# Patient Record
Sex: Male | Born: 1945
Health system: Southern US, Community
[De-identification: ages and names within clinical notes are randomized; demographics above are authoritative.]

## PROBLEM LIST (undated history)

## (undated) DIAGNOSIS — F329 Major depressive disorder, single episode, unspecified: Secondary | ICD-10-CM

## (undated) DIAGNOSIS — I4891 Unspecified atrial fibrillation: Secondary | ICD-10-CM

## (undated) DIAGNOSIS — E785 Hyperlipidemia, unspecified: Secondary | ICD-10-CM

## (undated) DIAGNOSIS — I10 Essential (primary) hypertension: Secondary | ICD-10-CM

## (undated) DIAGNOSIS — F32A Depression, unspecified: Secondary | ICD-10-CM

## (undated) DIAGNOSIS — J449 Chronic obstructive pulmonary disease, unspecified: Secondary | ICD-10-CM

## (undated) DIAGNOSIS — I639 Cerebral infarction, unspecified: Secondary | ICD-10-CM

## (undated) HISTORY — DX: Depression, unspecified: F32.A

## (undated) HISTORY — DX: Major depressive disorder, single episode, unspecified: F32.9

## (undated) HISTORY — PX: CAROTID ENDARTERECTOMY: SUR193

## (undated) HISTORY — DX: Cerebral infarction, unspecified: I63.9

## (undated) HISTORY — PX: ANKLE SURGERY: SHX546

## (undated) HISTORY — DX: Essential (primary) hypertension: I10

## (undated) HISTORY — DX: Hyperlipidemia, unspecified: E78.5

## (undated) HISTORY — DX: Chronic obstructive pulmonary disease, unspecified: J44.9

---

## 2011-07-20 DIAGNOSIS — E559 Vitamin D deficiency, unspecified: Secondary | ICD-10-CM | POA: Diagnosis not present

## 2011-07-20 DIAGNOSIS — R079 Chest pain, unspecified: Secondary | ICD-10-CM | POA: Diagnosis not present

## 2011-07-20 DIAGNOSIS — I10 Essential (primary) hypertension: Secondary | ICD-10-CM | POA: Diagnosis not present

## 2011-07-20 DIAGNOSIS — R7309 Other abnormal glucose: Secondary | ICD-10-CM | POA: Diagnosis not present

## 2011-07-20 DIAGNOSIS — E782 Mixed hyperlipidemia: Secondary | ICD-10-CM | POA: Diagnosis not present

## 2011-07-30 ENCOUNTER — Ambulatory Visit (HOSPITAL_COMMUNITY): Payer: Medicare Other | Attending: Internal Medicine | Admitting: Radiology

## 2011-07-30 ENCOUNTER — Encounter: Payer: Self-pay | Admitting: Internal Medicine

## 2011-07-30 VITALS — BP 140/82 | HR 63 | Ht 71.0 in | Wt 258.0 lb

## 2011-07-30 DIAGNOSIS — R0602 Shortness of breath: Secondary | ICD-10-CM

## 2011-07-30 DIAGNOSIS — R079 Chest pain, unspecified: Secondary | ICD-10-CM | POA: Diagnosis not present

## 2011-07-30 MED ORDER — TECHNETIUM TC 99M TETROFOSMIN IV KIT
11.0000 | PACK | Freq: Once | INTRAVENOUS | Status: AC | PRN
  Administered 2011-07-30: 11 via INTRAVENOUS

## 2011-07-30 NOTE — Progress Notes (Signed)
[facility] SITE 3 NUCLEAR MED [address] [place] [address] [phone]  Cardiology Nuclear Med Study  George Delgado is a 66 y.o. male     MRN : [id number]     DOB: 05-05-1945  Procedure Date: 08/02/2011  Nuclear Med Background Indication for Stress Test:  Evaluation for Ischemia History:  1980's MPS @ [facility] per pt normal. Cardiac Risk Factors: Carotid Disease, History of Smoking, Hypertension, Lipids and Obesity  Symptoms:  Chest Pressure with Exertion (last episode of chest discomfort was about 3-weeks ago), Diaphoresis, DOE, Palpitations and Rapid HR   Nuclear Pre-Procedure Caffeine/Decaff Intake:  None NPO After: 7:00am   Lungs:  Clear. O2 Sat: 98% on room air. IV 0.9% NS with Angio Cath:  22g  IV Site: R Hand  IV Started by:  Stanton Kidney, EMT-P  Chest Size (in):  44 Cup Size: n/a  Height: 5\' 11"  (1.803 m)  Weight:  258 lb (117.028 kg)  BMI:  Body mass index is 35.98 kg/(m^2). Tech Comments:  Atenolol held > 24 hours, per patient.    Nuclear Med Study 1 or 2 day study: 2 day  Stress Test Type:  Treadmill/Lexiscan  Reading MD: Willa Rough, MD  Order Authorizing Provider:  Lucky Cowboy, MD  Resting Radionuclide: Technetium 3m Tetrofosmin  Resting Radionuclide Dose: 11.0 mCi   Stress Radionuclide:  Technetium 7m Tetrofosmin  Stress Radionuclide Dose: 33.0 mCi           Stress Protocol Rest HR: 63 Stress HR: 96  Rest BP: 140/82 Stress BP: 157/77  Exercise Time (min): 2:00 METS: n/a   Predicted Max HR: 152 bpm % Max HR: 63.16 bpm Rate Pressure Product: 21308   Dose of Adenosine (mg):  n/a Dose of Lexiscan: 0.4 mg  Dose of Atropine (mg): n/a Dose of Dobutamine: n/a mcg/kg/min (at max HR)  Stress Test Technologist: Smiley Houseman, CMA-N  Nuclear Technologist:  Domenic Polite, CNMT     Rest Procedure:  Myocardial perfusion imaging was performed at rest 45 minutes following the intravenous administration of Technetium 69m  Tetrofosmin.  Rest ECG: No acute changes  Stress Procedure:  The patient attempted to walk the treadmill for 7:16, but was unable to reach his target heart rate and could not be changed to Lexiscan this day due to caffeine.  He had no diagnostic ST-T wave changes or chest pain.  He did have occasional PVC's/PAC's and a hypertensive response of 201/96.   The patient came back a 2nd day and received IV Lexiscan 0.4 mg over 15-seconds with concurrent low level exercise and then Technetium 56m Tetrofosmin was injected at 30-seconds while the patient continued walking one more minute. There were no significant changes with Lexiscan. Quantitative spect images were obtained after a 45-minute delay.  Stress ECG: No significant change from baseline ECG  QPS Raw Data Images:  Patient motion noted; appropriate software correction applied. Stress Images:  Normal homogeneous uptake in all areas of the myocardium. Rest Images:  Normal homogeneous uptake in all areas of the myocardium. Subtraction (SDS):  No evidence of ischemia. Transient Ischemic Dilatation (Normal <1.22):  0.99 Lung/Heart Ratio (Normal <0.45):  0.33  Quantitative Gated Spect Images QGS EDV:  54 ml QGS ESV:  12 ml  Impression Exercise Capacity:  Lexiscan with low level exercise. BP Response:  Normal blood pressure response. Clinical Symptoms:  shortness of breath ECG Impression:  No significant ST segment change suggestive of ischemia. Comparison with Prior Nuclear Study: No previous nuclear  study performed  Overall Impression:  Normal stress nuclear study.  LV Ejection Fraction: 77%.  LV Wall Motion:  Normal Wall Motion  Willa Rough, MD

## 2011-08-02 ENCOUNTER — Ambulatory Visit (HOSPITAL_COMMUNITY): Payer: Medicare Other | Attending: Cardiology

## 2011-08-02 DIAGNOSIS — R0989 Other specified symptoms and signs involving the circulatory and respiratory systems: Secondary | ICD-10-CM

## 2011-08-02 MED ORDER — REGADENOSON 0.4 MG/5ML IV SOLN
0.4000 mg | Freq: Once | INTRAVENOUS | Status: AC
  Administered 2011-08-02: 0.4 mg via INTRAVENOUS

## 2011-08-02 MED ORDER — TECHNETIUM TC 99M TETROFOSMIN IV KIT
33.0000 | PACK | Freq: Once | INTRAVENOUS | Status: AC | PRN
  Administered 2011-08-02: 33 via INTRAVENOUS

## 2011-08-03 NOTE — Progress Notes (Signed)
Copy of nuclear report Faxed to Dr. Astrid Divine @ [phone].[place]

## 2011-09-01 DIAGNOSIS — W108XXA Fall (on) (from) other stairs and steps, initial encounter: Secondary | ICD-10-CM | POA: Diagnosis not present

## 2011-09-01 DIAGNOSIS — S4980XA Other specified injuries of shoulder and upper arm, unspecified arm, initial encounter: Secondary | ICD-10-CM | POA: Diagnosis not present

## 2011-09-01 DIAGNOSIS — M25519 Pain in unspecified shoulder: Secondary | ICD-10-CM | POA: Diagnosis not present

## 2011-09-10 DIAGNOSIS — M79609 Pain in unspecified limb: Secondary | ICD-10-CM | POA: Diagnosis not present

## 2011-09-10 DIAGNOSIS — R55 Syncope and collapse: Secondary | ICD-10-CM | POA: Diagnosis not present

## 2011-09-10 DIAGNOSIS — R079 Chest pain, unspecified: Secondary | ICD-10-CM | POA: Diagnosis not present

## 2011-09-20 DIAGNOSIS — I6789 Other cerebrovascular disease: Secondary | ICD-10-CM | POA: Diagnosis not present

## 2011-09-20 DIAGNOSIS — R002 Palpitations: Secondary | ICD-10-CM | POA: Diagnosis not present

## 2011-09-20 DIAGNOSIS — Z0000 Encounter for general adult medical examination without abnormal findings: Secondary | ICD-10-CM | POA: Diagnosis not present

## 2011-09-20 DIAGNOSIS — Z79899 Other long term (current) drug therapy: Secondary | ICD-10-CM | POA: Diagnosis not present

## 2011-09-20 DIAGNOSIS — I10 Essential (primary) hypertension: Secondary | ICD-10-CM | POA: Diagnosis not present

## 2011-10-24 DIAGNOSIS — I498 Other specified cardiac arrhythmias: Secondary | ICD-10-CM | POA: Diagnosis not present

## 2011-10-24 DIAGNOSIS — I4949 Other premature depolarization: Secondary | ICD-10-CM | POA: Diagnosis not present

## 2011-10-24 DIAGNOSIS — R002 Palpitations: Secondary | ICD-10-CM | POA: Diagnosis not present

## 2011-10-27 DIAGNOSIS — R002 Palpitations: Secondary | ICD-10-CM | POA: Diagnosis not present

## 2011-11-02 DIAGNOSIS — I10 Essential (primary) hypertension: Secondary | ICD-10-CM | POA: Diagnosis not present

## 2011-11-02 DIAGNOSIS — Z79899 Other long term (current) drug therapy: Secondary | ICD-10-CM | POA: Diagnosis not present

## 2011-11-02 DIAGNOSIS — E559 Vitamin D deficiency, unspecified: Secondary | ICD-10-CM | POA: Diagnosis not present

## 2011-11-02 DIAGNOSIS — R7309 Other abnormal glucose: Secondary | ICD-10-CM | POA: Diagnosis not present

## 2011-11-02 DIAGNOSIS — E782 Mixed hyperlipidemia: Secondary | ICD-10-CM | POA: Diagnosis not present

## 2012-01-03 DIAGNOSIS — G40309 Generalized idiopathic epilepsy and epileptic syndromes, not intractable, without status epilepticus: Secondary | ICD-10-CM | POA: Diagnosis not present

## 2012-01-03 DIAGNOSIS — R569 Unspecified convulsions: Secondary | ICD-10-CM | POA: Diagnosis not present

## 2012-01-03 DIAGNOSIS — G40802 Other epilepsy, not intractable, without status epilepticus: Secondary | ICD-10-CM | POA: Diagnosis not present

## 2012-01-03 DIAGNOSIS — I6350 Cerebral infarction due to unspecified occlusion or stenosis of unspecified cerebral artery: Secondary | ICD-10-CM | POA: Diagnosis not present

## 2012-01-03 DIAGNOSIS — E785 Hyperlipidemia, unspecified: Secondary | ICD-10-CM | POA: Diagnosis not present

## 2012-01-03 DIAGNOSIS — Z79899 Other long term (current) drug therapy: Secondary | ICD-10-CM | POA: Diagnosis not present

## 2012-01-03 DIAGNOSIS — I69998 Other sequelae following unspecified cerebrovascular disease: Secondary | ICD-10-CM | POA: Diagnosis not present

## 2012-01-03 DIAGNOSIS — I6990 Unspecified sequelae of unspecified cerebrovascular disease: Secondary | ICD-10-CM | POA: Diagnosis not present

## 2012-01-03 DIAGNOSIS — R404 Transient alteration of awareness: Secondary | ICD-10-CM | POA: Diagnosis not present

## 2012-01-03 DIAGNOSIS — S0993XA Unspecified injury of face, initial encounter: Secondary | ICD-10-CM | POA: Diagnosis not present

## 2012-01-03 DIAGNOSIS — I10 Essential (primary) hypertension: Secondary | ICD-10-CM | POA: Diagnosis not present

## 2012-01-03 DIAGNOSIS — R4182 Altered mental status, unspecified: Secondary | ICD-10-CM | POA: Diagnosis not present

## 2012-01-03 DIAGNOSIS — Z87891 Personal history of nicotine dependence: Secondary | ICD-10-CM | POA: Diagnosis not present

## 2012-01-03 DIAGNOSIS — E7800 Pure hypercholesterolemia, unspecified: Secondary | ICD-10-CM | POA: Diagnosis not present

## 2012-01-03 DIAGNOSIS — N179 Acute kidney failure, unspecified: Secondary | ICD-10-CM | POA: Diagnosis not present

## 2012-01-22 DIAGNOSIS — E559 Vitamin D deficiency, unspecified: Secondary | ICD-10-CM | POA: Diagnosis not present

## 2012-01-22 DIAGNOSIS — E782 Mixed hyperlipidemia: Secondary | ICD-10-CM | POA: Diagnosis not present

## 2012-01-22 DIAGNOSIS — Z79899 Other long term (current) drug therapy: Secondary | ICD-10-CM | POA: Diagnosis not present

## 2012-01-22 DIAGNOSIS — Z1212 Encounter for screening for malignant neoplasm of rectum: Secondary | ICD-10-CM | POA: Diagnosis not present

## 2012-01-22 DIAGNOSIS — R972 Elevated prostate specific antigen [PSA]: Secondary | ICD-10-CM | POA: Diagnosis not present

## 2012-01-22 DIAGNOSIS — N419 Inflammatory disease of prostate, unspecified: Secondary | ICD-10-CM | POA: Diagnosis not present

## 2012-01-22 DIAGNOSIS — I10 Essential (primary) hypertension: Secondary | ICD-10-CM | POA: Diagnosis not present

## 2012-01-22 DIAGNOSIS — R7309 Other abnormal glucose: Secondary | ICD-10-CM | POA: Diagnosis not present

## 2012-01-29 DIAGNOSIS — H2510 Age-related nuclear cataract, unspecified eye: Secondary | ICD-10-CM | POA: Diagnosis not present

## 2012-03-20 DIAGNOSIS — I10 Essential (primary) hypertension: Secondary | ICD-10-CM | POA: Diagnosis not present

## 2012-03-20 DIAGNOSIS — E559 Vitamin D deficiency, unspecified: Secondary | ICD-10-CM | POA: Diagnosis not present

## 2012-03-20 DIAGNOSIS — R7309 Other abnormal glucose: Secondary | ICD-10-CM | POA: Diagnosis not present

## 2012-03-20 DIAGNOSIS — N3000 Acute cystitis without hematuria: Secondary | ICD-10-CM | POA: Diagnosis not present

## 2012-03-20 DIAGNOSIS — R972 Elevated prostate specific antigen [PSA]: Secondary | ICD-10-CM | POA: Diagnosis not present

## 2012-03-20 DIAGNOSIS — Z79899 Other long term (current) drug therapy: Secondary | ICD-10-CM | POA: Diagnosis not present

## 2012-07-24 DIAGNOSIS — Z79899 Other long term (current) drug therapy: Secondary | ICD-10-CM | POA: Diagnosis not present

## 2012-07-24 DIAGNOSIS — I10 Essential (primary) hypertension: Secondary | ICD-10-CM | POA: Diagnosis not present

## 2012-07-24 DIAGNOSIS — R7309 Other abnormal glucose: Secondary | ICD-10-CM | POA: Diagnosis not present

## 2012-07-24 DIAGNOSIS — E782 Mixed hyperlipidemia: Secondary | ICD-10-CM | POA: Diagnosis not present

## 2012-08-13 DIAGNOSIS — G4730 Sleep apnea, unspecified: Secondary | ICD-10-CM | POA: Diagnosis not present

## 2012-08-13 DIAGNOSIS — R0989 Other specified symptoms and signs involving the circulatory and respiratory systems: Secondary | ICD-10-CM | POA: Diagnosis not present

## 2012-08-13 DIAGNOSIS — G4710 Hypersomnia, unspecified: Secondary | ICD-10-CM | POA: Diagnosis not present

## 2012-08-13 DIAGNOSIS — G4733 Obstructive sleep apnea (adult) (pediatric): Secondary | ICD-10-CM | POA: Diagnosis not present

## 2012-10-06 DIAGNOSIS — G4710 Hypersomnia, unspecified: Secondary | ICD-10-CM | POA: Diagnosis not present

## 2012-10-06 DIAGNOSIS — G4761 Periodic limb movement disorder: Secondary | ICD-10-CM | POA: Diagnosis not present

## 2012-10-06 DIAGNOSIS — G4730 Sleep apnea, unspecified: Secondary | ICD-10-CM | POA: Diagnosis not present

## 2012-10-06 DIAGNOSIS — G4733 Obstructive sleep apnea (adult) (pediatric): Secondary | ICD-10-CM | POA: Diagnosis not present

## 2012-10-06 DIAGNOSIS — R0609 Other forms of dyspnea: Secondary | ICD-10-CM | POA: Diagnosis not present

## 2012-11-03 DIAGNOSIS — E559 Vitamin D deficiency, unspecified: Secondary | ICD-10-CM | POA: Diagnosis not present

## 2012-11-03 DIAGNOSIS — I10 Essential (primary) hypertension: Secondary | ICD-10-CM | POA: Diagnosis not present

## 2012-11-03 DIAGNOSIS — D485 Neoplasm of uncertain behavior of skin: Secondary | ICD-10-CM | POA: Diagnosis not present

## 2012-11-03 DIAGNOSIS — Z79899 Other long term (current) drug therapy: Secondary | ICD-10-CM | POA: Diagnosis not present

## 2012-11-03 DIAGNOSIS — L738 Other specified follicular disorders: Secondary | ICD-10-CM | POA: Diagnosis not present

## 2012-11-03 DIAGNOSIS — B079 Viral wart, unspecified: Secondary | ICD-10-CM | POA: Diagnosis not present

## 2012-11-03 DIAGNOSIS — E538 Deficiency of other specified B group vitamins: Secondary | ICD-10-CM | POA: Diagnosis not present

## 2012-11-03 DIAGNOSIS — R7309 Other abnormal glucose: Secondary | ICD-10-CM | POA: Diagnosis not present

## 2012-11-03 DIAGNOSIS — E782 Mixed hyperlipidemia: Secondary | ICD-10-CM | POA: Diagnosis not present

## 2012-11-24 DIAGNOSIS — G4733 Obstructive sleep apnea (adult) (pediatric): Secondary | ICD-10-CM | POA: Diagnosis not present

## 2012-11-25 DIAGNOSIS — G4733 Obstructive sleep apnea (adult) (pediatric): Secondary | ICD-10-CM | POA: Diagnosis not present

## 2012-11-28 DIAGNOSIS — G4733 Obstructive sleep apnea (adult) (pediatric): Secondary | ICD-10-CM | POA: Diagnosis not present

## 2012-12-31 DIAGNOSIS — G4733 Obstructive sleep apnea (adult) (pediatric): Secondary | ICD-10-CM | POA: Diagnosis not present

## 2013-01-06 DIAGNOSIS — Z23 Encounter for immunization: Secondary | ICD-10-CM | POA: Diagnosis not present

## 2013-01-20 DIAGNOSIS — G4733 Obstructive sleep apnea (adult) (pediatric): Secondary | ICD-10-CM | POA: Diagnosis not present

## 2013-01-28 ENCOUNTER — Encounter: Payer: Self-pay | Admitting: Internal Medicine

## 2013-01-28 DIAGNOSIS — E782 Mixed hyperlipidemia: Secondary | ICD-10-CM | POA: Insufficient documentation

## 2013-01-28 DIAGNOSIS — I2510 Atherosclerotic heart disease of native coronary artery without angina pectoris: Secondary | ICD-10-CM | POA: Insufficient documentation

## 2013-01-28 DIAGNOSIS — I10 Essential (primary) hypertension: Secondary | ICD-10-CM | POA: Insufficient documentation

## 2013-01-28 DIAGNOSIS — R7303 Prediabetes: Secondary | ICD-10-CM | POA: Insufficient documentation

## 2013-01-28 NOTE — Progress Notes (Signed)
Patient ID: George Delgado, male   DOB: 1945/10/03, 67 y.o.   MRN: [id number]  Annual Screening Comprehensive Examination  This very nice 67 yo MWM presents for complete physical.  Patient has been followed for HTN, ASCVD with Hx/o CVA and consequent seizure disorder, Diabetes, Morbid Obesity, OSA - on CPAP,  Hyperlipidemia and Vitamin D Deficiency.   Patient's BP has been controlled at home. Patient denies any cardiac Symptoms as chest pain, palpitations, shortness of breath, dizziness or ankle swelling.   Patient also has history of ASCVD and CVA's. Patient has hx/o seizures subsequent and has been stable since on Keppra w/o recurrent seizures.   Patient's hyperlipidemia is controlled with diet and medications. Patient denies myalgias or other medication SE's. Last cholesterol last visit was 122, triglycerides 76, HDL 41 and LDL  66 at goal. .     Patient has prediabetes/Diet controlled NIDDM  with last A1c 6.2% in August.Patient has gained about 20 # in the last year felt related in part to his SSRI tx. Patient denies reactive hypoglycemic symptoms, visual blurring, diabetic polys, or paresthesias.    Patient has been on CPAP since August for OSA and thus far perceives no benefit. He did receive a new mask 3-4 days ago and is deferring judgement to continue pending response of benefit with the new mask.   Finally, patient has history of Vitamin D Deficiency with last vitamin D 85.   Current outpatient prescriptions: aspirin 325 MG tablet, Take 325 mg by mouth daily  atenolol  50 MG tablet, Take 50 mg by mouth daily   atorvastatin  80 MG tablet, Take 80 mg by mouth daily.  Lisinopril/ Hctz 20/12.5 - Take 1/2 tablet daily MULTIVITAMIN 1 tablet  SYMBICORT 160-4.5  2 puffs into the lungs times VITAMIN D 1000 UNITS tablet. Take 8,000 IU  sertraline 100 MG 1 QD    No Known Allergies  Past Medical History  Diagnosis Date  . Hypertension   . Stroke   . Hyperlipidemia   . COPD (chronic  obstructive pulmonary disease)   . Depression     Past Surgical History  Procedure Laterality Date  . Ankle surgery    . Carotid endarterectomy      06/2010    Family History  Problem Relation Age of Onset  . Ovarian cancer Mother   . Hypertension Mother   . Hypertension Father   . Cancer Father   . Hypertension Sister   . Hypertension Brother   . Diabetes Brother   . Diabetes Daughter   . Diabetes Son     History   Social History  . Marital Status: Widowed, in long term  heterosexual domestic partnership    Spouse Name: N/A    Number of Children: N/A   Occupational History   Retired 2001 from [employer] work.     Social History Main Topics  . Smoking status: Former Smoker    Quit date: 05/11/2010  . Smokeless tobacco: Never Used  . Alcohol Use: No  . Drug Use: No        Constitutional: Denies fever, chills, weight loss/gain, headaches, insomnia, fatigue, night sweats, and change in appetite. Eyes: Denies redness, blurred vision, diplopia, discharge, itchy, watery eyes.  ENT: Denies discharge, congestion, post nasal drip, epistaxis, sore throat, earache, hearing loss, dental pain, Tinnitus, Vertigo, Sinus pain, snoring.  Cardio: Denies chest pain, palpitations, irregular heartbeat, syncope, dyspnea, diaphoresis, orthopnea, PND, claudication, edema Respiratory: denies cough, dyspnea, +  DOE,  - pleurisy, hoarseness, laryngitis,  wheezing.  Gastrointestinal: Denies dysphagia, heartburn, reflux, water brash, pain, cramps, nausea, vomiting, bloating, diarrhea, constipation, hematemesis, melena, hematochezia, jaundice, hemorrhoids Genitourinary: Denies dysuria, frequency, urgency, nocturia, hesitancy, discharge, hematuria, flank pain Musculoskeletal: Denies arthralgia, myalgia, stiffness, Jt. Swelling, pain, limp, and strain/sprain. Skin: Denies puritis, rash, hives, warts, acne, eczema, changing in skin lesion Neuro: Weakness, tremor, incoordination, spasms, paresthesia,  pain Psychiatric: Denies confusion, memory loss, sensory loss Endocrine: Denies change in weight, skin, hair change, nocturia, and paresthesia, diabetic polys, visual blurring, hyper /hypo glycemic episodes.  Heme/Lymph: No excessive bleeding, bruising, enlarged lymph nodes.  BP 116/ 74    P 72    R 16     Ht 5'10"    Wt 266 #    Physical Exam General Appearance: Well nourished, in no apparent distress. Eyes: PERRLA, EOMs, conjunctiva no swelling or erythema, normal fundi and vessels. Sinuses: No frontal/maxillary tenderness ENT/Mouth: EACs patent / TMs  nl. Nares clear without erythema, swelling, mucoid exudates. Oral hygiene is good. No erythema, swelling, or exudate. Tongue normal, non-obstructing. Tonsils not swollen or erythematous. Hearing normal.  Neck: Supple, thyroid normal. No bruits, nodes or JVD. Respiratory: Respiratory effort normal.  BS equal and clear bilateral without rales, rhonci, wheezing or stridor. Cardio: Heart sounds are normal with regular rate and rhythm and no murmurs, rubs or gallops. Peripheral pulses are normal and equal bilaterally without edema. No aortic or femoral bruits. Chest: Kyphotic with increased AP diameter. Symmetric with normal excursions and percussion.  Abdomen: Flat, soft, with bowl sounds. Nontender, no guarding, rebound, hernias, masses, or organomegaly.  Lymphatics: Non tender without lymphadenopathy.  Genitourinary: No hernias.Testes nl. DRE - prostate nl for age - smooth & firm w/o nodules. Musculoskeletal: Full ROM all peripheral extremities, joint stability, 5/5 strength, and normal gait. Skin: Warm and dry without rashes, lesions, cyanosis, clubbing or  ecchymosis.  Neuro: Cranial nerves intact, reflexes equal bilaterally. Normal muscle tone, no cerebellar symptoms. Sensation intact.  Pysch: Awake and oriented X 3, normal affect, Insight and Judgment appropriate.   Assessment and Plan  1. Annual Screening Examination 2. Hypertension   3. Hyperlipidemia 4. Pre Diabetes 5. Vitamin D Deficiency 6. ASCVD/CVA 7. Seizures 8. Morbid Obesity  Continue prudent diet as discussed, weight control, regular exercise, and medications. Routine screening labs and tests as requested with regular follow-up as recommended.Will give trial with phentermine for wt. Loss. Also, schedule dBMD to r/o osteoporosis.

## 2013-01-29 ENCOUNTER — Encounter: Payer: Self-pay | Admitting: Internal Medicine

## 2013-01-29 ENCOUNTER — Ambulatory Visit: Payer: Medicare Other | Admitting: Internal Medicine

## 2013-01-29 VITALS — BP 116/74 | HR 72 | Temp 98.4°F | Resp 16 | Ht 70.0 in | Wt 266.6 lb

## 2013-01-29 DIAGNOSIS — I10 Essential (primary) hypertension: Secondary | ICD-10-CM | POA: Diagnosis not present

## 2013-01-29 DIAGNOSIS — G40802 Other epilepsy, not intractable, without status epilepticus: Secondary | ICD-10-CM

## 2013-01-29 DIAGNOSIS — R7309 Other abnormal glucose: Secondary | ICD-10-CM

## 2013-01-29 DIAGNOSIS — G40309 Generalized idiopathic epilepsy and epileptic syndromes, not intractable, without status epilepticus: Secondary | ICD-10-CM

## 2013-01-29 DIAGNOSIS — N410 Acute prostatitis: Secondary | ICD-10-CM | POA: Diagnosis not present

## 2013-01-29 DIAGNOSIS — M810 Age-related osteoporosis without current pathological fracture: Secondary | ICD-10-CM

## 2013-01-29 DIAGNOSIS — E559 Vitamin D deficiency, unspecified: Secondary | ICD-10-CM

## 2013-01-29 DIAGNOSIS — Z0000 Encounter for general adult medical examination without abnormal findings: Secondary | ICD-10-CM | POA: Diagnosis not present

## 2013-01-29 DIAGNOSIS — Z79899 Other long term (current) drug therapy: Secondary | ICD-10-CM | POA: Diagnosis not present

## 2013-01-29 DIAGNOSIS — G4733 Obstructive sleep apnea (adult) (pediatric): Secondary | ICD-10-CM | POA: Insufficient documentation

## 2013-01-29 DIAGNOSIS — E782 Mixed hyperlipidemia: Secondary | ICD-10-CM

## 2013-01-29 DIAGNOSIS — Z1212 Encounter for screening for malignant neoplasm of rectum: Secondary | ICD-10-CM

## 2013-01-29 DIAGNOSIS — I2510 Atherosclerotic heart disease of native coronary artery without angina pectoris: Secondary | ICD-10-CM

## 2013-01-29 DIAGNOSIS — Z125 Encounter for screening for malignant neoplasm of prostate: Secondary | ICD-10-CM

## 2013-01-29 LAB — BASIC METABOLIC PANEL WITH GFR
BUN: 34 mg/dL — ABNORMAL HIGH (ref 6–23)
CO2: 32 mEq/L (ref 19–32)
Calcium: 9.9 mg/dL (ref 8.4–10.5)
Creat: 1.08 mg/dL (ref 0.50–1.35)
Glucose, Bld: 90 mg/dL (ref 70–99)
Potassium: 4.8 mEq/L (ref 3.5–5.3)

## 2013-01-29 LAB — CBC WITH DIFFERENTIAL/PLATELET
Basophils Absolute: 0.1 10*3/uL (ref 0.0–0.1)
Basophils Relative: 1 % (ref 0–1)
Eosinophils Relative: 3 % (ref 0–5)
HCT: 43 % (ref 39.0–52.0)
Hemoglobin: 14.6 g/dL (ref 13.0–17.0)
MCH: 30.5 pg (ref 26.0–34.0)
MCHC: 34 g/dL (ref 30.0–36.0)
MCV: 90 fL (ref 78.0–100.0)
Monocytes Absolute: 0.6 10*3/uL (ref 0.1–1.0)
Monocytes Relative: 9 % (ref 3–12)
Neutro Abs: 3.8 10*3/uL (ref 1.7–7.7)
RBC: 4.78 MIL/uL (ref 4.22–5.81)
RDW: 14.5 % (ref 11.5–15.5)
WBC: 6.8 10*3/uL (ref 4.0–10.5)

## 2013-01-29 LAB — HEMOGLOBIN A1C
Hgb A1c MFr Bld: 6.1 % — ABNORMAL HIGH (ref <5.7)
Mean Plasma Glucose: 128 mg/dL — ABNORMAL HIGH (ref <117)

## 2013-01-29 LAB — HEPATIC FUNCTION PANEL
ALT: 22 U/L (ref 0–53)
AST: 16 U/L (ref 0–37)
Albumin: 4.3 g/dL (ref 3.5–5.2)
Alkaline Phosphatase: 46 U/L (ref 39–117)
Bilirubin, Direct: 0.1 mg/dL (ref 0.0–0.3)
Total Bilirubin: 0.3 mg/dL (ref 0.3–1.2)
Total Protein: 6.5 g/dL (ref 6.0–8.3)

## 2013-01-29 LAB — LIPID PANEL
Cholesterol: 141 mg/dL (ref 0–200)
Total CHOL/HDL Ratio: 3.4 Ratio

## 2013-01-29 LAB — MAGNESIUM: Magnesium: 1.9 mg/dL (ref 1.5–2.5)

## 2013-01-29 MED ORDER — PHENTERMINE HCL 37.5 MG PO TABS: ORAL_TABLET | ORAL | Status: DC

## 2013-01-29 NOTE — Patient Instructions (Signed)
Continue diet & medications same as discussed.   Further disposition pending lab results.     Hypertension As your heart beats, it forces blood through your arteries. This force is your blood pressure. If the pressure is too high, it is called hypertension (HTN) or high blood pressure. HTN is dangerous because you may have it and not know it. High blood pressure may mean that your heart has to work harder to pump blood. Your arteries may be narrow or stiff. The extra work puts you at risk for heart disease, stroke, and other problems.  Blood pressure consists of two numbers, a higher number over a lower, 110/72, for example. It is stated as "110 over 72." The ideal is below 120 for the top number (systolic) and under 80 for the bottom (diastolic). Write down your blood pressure today. You should pay close attention to your blood pressure if you have certain conditions such as:  Heart failure.  Prior heart attack.  Diabetes  Chronic kidney disease.  Prior stroke.  Multiple risk factors for heart disease. To see if you have HTN, your blood pressure should be measured while you are seated with your arm held at the level of the heart. It should be measured at least twice. A one-time elevated blood pressure reading (especially in the Emergency Department) does not mean that you need treatment. There may be conditions in which the blood pressure is different between your right and left arms. It is important to see your caregiver soon for a recheck. Most people have essential hypertension which means that there is not a specific cause. This type of high blood pressure may be lowered by changing lifestyle factors such as:  Stress.  Smoking.  Lack of exercise.  Excessive weight.  Drug/tobacco/alcohol use.  Eating less salt. Most people do not have symptoms from high blood pressure until it has caused damage to the body. Effective treatment can often prevent, delay or reduce that  damage. TREATMENT  When a cause has been identified, treatment for high blood pressure is directed at the cause. There are a large number of medications to treat HTN. These fall into several categories, and your caregiver will help you select the medicines that are best for you. Medications may have side effects. You should review side effects with your caregiver. If your blood pressure stays high after you have made lifestyle changes or started on medicines,   Your medication(s) may need to be changed.  Other problems may need to be addressed.  Be certain you understand your prescriptions, and know how and when to take your medicine.  Be sure to follow up with your caregiver within the time frame advised (usually within two weeks) to have your blood pressure rechecked and to review your medications.  If you are taking more than one medicine to lower your blood pressure, make sure you know how and at what times they should be taken. Taking two medicines at the same time can result in blood pressure that is too low. SEEK IMMEDIATE MEDICAL CARE IF:  You develop a severe headache, blurred or changing vision, or confusion.  You have unusual weakness or numbness, or a faint feeling.  You have severe chest or abdominal pain, vomiting, or breathing problems. MAKE SURE YOU:   Understand these instructions.  Will watch your condition.  Will get help right away if you are not doing well or get worse. Document Released: 02/26/2005 Document Revised: 05/21/2011 Document Reviewed: 10/17/2007 ExitCare Patient Information 2014   [employer]. Cholesterol Cholesterol is a white, waxy, fat-like protein needed by your body in small amounts. The liver makes all the cholesterol you need. It is carried from the liver by the blood through the blood vessels. Deposits (plaque) may build up on blood vessel walls. This makes the arteries narrower and stiffer. Plaque increases the risk for heart attack and  stroke. You cannot feel your cholesterol level even if it is very high. The only way to know is by a blood test to check your lipid (fats) levels. Once you know your cholesterol levels, you should keep a record of the test results. Work with your caregiver to to keep your levels in the desired range. WHAT THE RESULTS MEAN:  Total cholesterol is a rough measure of all the cholesterol in your blood.  LDL is the so-called bad cholesterol. This is the type that deposits cholesterol in the walls of the arteries. You want this level to be low.  HDL is the good cholesterol because it cleans the arteries and carries the LDL away. You want this level to be high.  Triglycerides are fat that the body can either burn for energy or store. High levels are closely linked to heart disease. DESIRED LEVELS:  Total cholesterol below 200.  LDL below 100 for people at risk, below 70 for very high risk.  HDL above 50 is good, above 60 is best.  Triglycerides below 150. HOW TO LOWER YOUR CHOLESTEROL:  Diet.  Choose fish or white meat chicken and turkey, roasted or baked. Limit fatty cuts of red meat, fried foods, and processed meats, such as sausage and lunch meat.  Eat lots of fresh fruits and vegetables. Choose whole grains, beans, pasta, potatoes and cereals.  Use only small amounts of olive, corn or canola oils. Avoid butter, mayonnaise, shortening or palm kernel oils. Avoid foods with trans-fats.  Use skim/nonfat milk and low-fat/nonfat yogurt and cheeses. Avoid whole milk, cream, ice cream, egg yolks and cheeses. Healthy desserts include angel food cake, ginger snaps, animal crackers, hard candy, popsicles, and low-fat/nonfat frozen yogurt. Avoid pastries, cakes, pies and cookies.  Exercise.  A regular program helps decrease LDL and raises HDL.  Helps with weight control.  Do things that increase your activity level like gardening, walking, or taking the stairs.  Medication.  May be  prescribed by your caregiver to help lowering cholesterol and the risk for heart disease.  You may need medicine even if your levels are normal if you have several risk factors. HOME CARE INSTRUCTIONS   Follow your diet and exercise programs as suggested by your caregiver.  Take medications as directed.  Have blood work done when your caregiver feels it is necessary. MAKE SURE YOU:   Understand these instructions.  Will watch your condition.  Will get help right away if you are not doing well or get worse. Document Released: 11/21/2000 Document Revised: 05/21/2011 Document Reviewed: 05/14/2007 Exit[employer] Patient Information 2014 [employer]. Vitamin D Deficiency Vitamin D is an important vitamin that your body needs. Having too little of it in your body is called a deficiency. A very bad deficiency can make your bones soft and can cause a condition called rickets.  Vitamin D is important to your body for different reasons, such as:   It helps your body absorb 2 minerals called calcium and phosphorus.  It helps make your bones healthy.  It may prevent some diseases, such as diabetes and multiple sclerosis.  It helps your muscles and heart.   You can get vitamin D in several ways. It is a natural part of some foods. The vitamin is also added to some dairy products and cereals. Some people take vitamin D supplements. Also, your body makes vitamin D when you are in the sun. It changes the sun's rays into a form of the vitamin that your body can use. CAUSES   Not eating enough foods that contain vitamin D.  Not getting enough sunlight.  Having certain digestive system diseases that make it hard to absorb vitamin D. These diseases include Crohn's disease, chronic pancreatitis, and cystic fibrosis.  Having a surgery in which part of the stomach or small intestine is removed.  Being obese. Fat cells pull vitamin D out of your blood. That means that obese people may not have enough  vitamin D left in their blood and in other body tissues.  Having chronic kidney or liver disease. RISK FACTORS Risk factors are things that make you more likely to develop a vitamin D deficiency. They include:  Being older.  Not being able to get outside very much.  Living in a nursing home.  Having had broken bones.  Having weak or thin bones (osteoporosis).  Having a disease or condition that changes how your body absorbs vitamin D.  Having dark skin.  Some medicines such as seizure medicines or steroids.  Being overweight or obese. SYMPTOMS Mild cases of vitamin D deficiency may not have any symptoms. If you have a very bad case, symptoms may include:  Bone pain.  Muscle pain.  Falling often.  Broken bones caused by a minor injury, due to osteoporosis. DIAGNOSIS A blood test is the best way to tell if you have a vitamin D deficiency. TREATMENT Vitamin D deficiency can be treated in different ways. Treatment for vitamin D deficiency depends on what is causing it. Options include:  Taking vitamin D supplements.  Taking a calcium supplement. Your caregiver will suggest what dose is best for you. HOME CARE INSTRUCTIONS  Take any supplements that your caregiver prescribes. Follow the directions carefully. Take only the suggested amount.  Have your blood tested 2 months after you start taking supplements.  Eat foods that contain vitamin D. Healthy choices include:  Fortified dairy products, cereals, or juices. Fortified means vitamin D has been added to the food. Check the label on the package to be sure.  Fatty fish like salmon or trout.  Eggs.  Oysters.  Do not use a tanning bed.  Keep your weight at a healthy level. Lose weight if you need to.  Keep all follow-up appointments. Your caregiver will need to perform blood tests to make sure your vitamin D deficiency is going away. SEEK MEDICAL CARE IF:  You have any questions about your treatment.  You  continue to have symptoms of vitamin D deficiency.  You have nausea or vomiting.  You are constipated.  You feel confused.  You have severe abdominal or back pain. MAKE SURE YOU:  Understand these instructions.  Will watch your condition.  Will get help right away if you are not doing well or get worse. Document Released: 05/21/2011 Document Revised: 06/23/2012 Document Reviewed: 05/21/2011 [employer] Patient Information 2014 [employer].  

## 2013-01-30 ENCOUNTER — Other Ambulatory Visit: Payer: Self-pay | Admitting: Internal Medicine

## 2013-01-30 LAB — MICROALBUMIN / CREATININE URINE RATIO
Creatinine, Urine: 146.3 mg/dL
Microalb Creat Ratio: 3.8 mg/g (ref 0.0–30.0)

## 2013-01-30 LAB — URINALYSIS, MICROSCOPIC ONLY
Crystals: NONE SEEN
Squamous Epithelial / LPF: NONE SEEN

## 2013-01-30 LAB — INSULIN, FASTING: Insulin fasting, serum: 19 u[IU]/mL (ref 3–28)

## 2013-01-30 LAB — TSH: TSH: 1.343 u[IU]/mL (ref 0.350–4.500)

## 2013-01-30 LAB — URINE CULTURE
Colony Count: NO GROWTH
Organism ID, Bacteria: NO GROWTH

## 2013-01-30 MED ORDER — LISINOPRIL 20 MG PO TABS: ORAL_TABLET | ORAL | Status: DC

## 2013-02-09 ENCOUNTER — Other Ambulatory Visit: Payer: Self-pay | Admitting: Internal Medicine

## 2013-03-26 ENCOUNTER — Other Ambulatory Visit: Payer: Self-pay | Admitting: Internal Medicine

## 2013-04-17 NOTE — Progress Notes (Signed)
Subjective:  George Delgado is a 68 y.o. male who presents for Medicare Annual Wellness Visit and 3 month follow up for HTN, hyperlipidemia, prediabetes, and vitamin D Def.  Date of last medicare wellness visit was is unknown.  His blood pressure has not been controlled at home, today their BP is BP: 110/68 mmHg He denies chest pain, shortness of breath, dizziness.  His cholesterol is diet controlled. In addition they are on Lipitor and denies myalgias. His cholesterol is controlled. The cholesterol last visit was:   Lab Results  Component Value Date   CHOL 141 01/29/2013   HDL 42 01/29/2013   LDLCALC 76 01/29/2013   TRIG 113 01/29/2013   CHOLHDL 3.4 01/29/2013   He has been working on diet and exercise for prediabetes, and denies blurry vision, polydipsia, polyphagia and polyuria. Last A1C in the office was:  Lab Results  Component Value Date   HGBA1C 6.1* 01/29/2013   Patient is on Vitamin D supplement.  He was recently put on Doxazosin for BPH symptoms which he states is helping. Denies dizziness. His BMI is Body mass index is 38.44 kg/(m^2). and he has been on phentermine. He has lost 2 lbs but patient states it has been 10 lbs he was 275 last visit.  Wt Readings from Last 3 Encounters:  05/01/13 264 lb (119.75 kg)  01/29/13 266 lb 9.6 oz (120.929 kg)  07/30/11 258 lb (117.028 kg)   Names of Other Physician/Practitioners you currently use: 1. [facility] here for primary care 2. Needs new local eye doctor, eye doctor, last visit 4 years 3. unknown, dentist, last visit unknown  Medical Services you may have received from other than [facility] providers in the past year (date may be approximate) N/A  Medication Review: Current Outpatient Prescriptions on File Prior to Visit  Medication Sig Dispense Refill  . aspirin 325 MG tablet Take 325 mg by mouth daily.      Marland Kitchen atenolol (TENORMIN) 50 MG tablet Take 50 mg by mouth daily.      Marland Kitchen  atorvastatin (LIPITOR) 80 MG tablet Take 80 mg by mouth daily. Take 1/2 tablet daily.      . budesonide-formoterol (SYMBICORT) 160-4.5 MCG/ACT inhaler Inhale 2 puffs into the lungs 2 (two) times daily.      . cholecalciferol (VITAMIN D) 1000 UNITS tablet Take 1,000 Units by mouth daily. Take 8,000 IU daily.      . finasteride (PROSCAR) 5 MG tablet TAKE ONE TABLET BY MOUTH EVERY DAY FOR  PROSTATE  90 tablet  1  . levETIRAcetam (KEPPRA) 500 MG tablet TAKE ONE TABLET BY MOUTH TWICE DAILY FOR  SEIZURES  60 tablet  2  . lisinopril (PRINIVIL,ZESTRIL) 20 MG tablet 1 tablet every morning for BP  90 tablet  11  . Multiple Vitamin (MULTIVITAMIN) tablet Take 1 tablet by mouth daily.      . phentermine (ADIPEX-P) 37.5 MG tablet 1/2 to 1 tablet each morning for appetite suppression and weight loss  30 tablet  3  . sertraline (ZOLOFT) 100 MG tablet Take 100 mg by mouth daily.       No current facility-administered medications on file prior to visit.    Current Problems (verified) Patient Active Problem List   Diagnosis Date Noted  . Unspecified vitamin D deficiency 04/30/2013  . Morbid obesity 04/30/2013  . Other forms of epilepsy and recurrent seizures without mention of intractable epilepsy 01/29/2013  . OSA on CPAP 01/29/2013  . Unspecified essential hypertension  01/28/2013  . ASCVD (arteriosclerotic cardiovascular disease) 01/28/2013  . Mixed hyperlipidemia 01/28/2013  . Other abnormal glucose 01/28/2013    Immunization History  Administered Date(s) Administered  . Influenza Whole 01/19/2011, 01/22/2012  . Influenza-Unspecified 12/28/2012    Screening Tests Health Maintenance  Topic Date Due  . Tetanus/tdap  07/08/1962  . Colonoscopy  07/07/1993  . Zostavax  07/08/2003  . Pneumococcal Polysaccharide Vaccine Age 34 And Over  07/07/2008  . Influenza Vaccine  10/10/2013    Preventative care: Last colonoscopy: 2008 due 2015  Prior vaccinations: TD or Tdap: 2008  Influenza:  12/2012 Pneumococcal: 2011 Shingles/Zostavax: wants, will check price  History reviewed: allergies, current medications, past family history, past medical history, past social history, past surgical history and problem list   Risk Factors: Tobacco History  Smoking status  . Former Smoker  . Quit date: 05/11/2010  Smokeless tobacco  . Never Used   He does not smoke.  Patient is a former smoker. Are there smokers in your home (other than you)?  No  Alcohol Current alcohol use: none  Caffeine Current caffeine use: coffee 5 /day  Exercise Current exercise habits: Home exercise routine includes treadmill and walking dog at the park.  Current exercise: walking and yard work  Nutrition/Diet Current diet: in general, a "healthy" diet    Cardiac risk factors: advanced age (older than 63 for men, 59 for women), dyslipidemia, family history of premature cardiovascular disease, hypertension, male gender, obesity (BMI >= 30 kg/m2), sedentary lifestyle and smoking/ tobacco exposure.  Depression Screen Nurse depression screen reviewed.  (Note: if answer to either of the following is "Yes", a more complete depression screening is indicated)   Q1: Over the past two weeks, have you felt down, depressed or hopeless? No  Q2: Over the past two weeks, have you felt little interest or pleasure in doing things? No  Have you lost interest or pleasure in daily life? No  Do you often feel hopeless? No  Do you cry easily over simple problems? No  Activities of Daily Living Nurse ADLs screen reviewed.  In your present state of health, do you have any difficulty performing the following activities?:  Driving? No- wife does the majority of the driving due to vision.  Managing money?  No Feeding yourself? No Getting from bed to chair? No Climbing a flight of stairs? No Preparing food and eating?: No Bathing or showering? No Getting dressed: No Getting to the toilet? No Using the  toilet:No Moving around from place to place: No In the past year have you fallen or had a near fall?:Yes- in the ice.    Are you sexually active?  No  Do you have more than one partner?  No  Vision Difficulties: Yes  Hearing Difficulties: Yes Do you often ask people to speak up or repeat themselves? Yes Do you experience ringing or noises in your ears? No Do you have difficulty understanding soft or whispered voices? Yes  Cognition  Do you feel that you have a problem with memory? Yes  Do you often misplace items? Yes  Do you feel safe at home?  Yes  Advanced directives Does patient have a [place]? Yes Does patient have a Living Will? Yes   Objective:     Vision and hearing screens reviewed.   Blood pressure 110/68, pulse 60, temperature 98.3 F (36.8 C), resp. rate 16, height 5' 9.5" (1.765 m), weight 264 lb (119.75 kg). Body mass index is 38.44 kg/(m^2).  General appearance: alert, no distress, WD/WN, male Cognitive Testing  Alert? Yes  Normal Appearance?Yes  Oriented to person? Yes  Place? Yes   Time? Yes  Recall of three objects?  Yes  Can perform simple calculations? Yes  Displays appropriate judgment?Yes  Can read the correct time from a watch face?Yes  HEENT: normocephalic, sclerae anicteric, TMs pearly, nares patent, no discharge or erythema, pharynx normal Oral cavity: MMM, no lesions Neck: supple, no lymphadenopathy, no thyromegaly, no masses Heart: RRR, normal S1, S2, no murmurs Lungs: CTA bilaterally, no wheezes, rhonchi, or rales Abdomen: +bs, soft, non tender, non distended, no masses, no hepatomegaly, no splenomegaly Musculoskeletal: nontender, no swelling, no obvious deformity Extremities: no edema, no cyanosis, no clubbing Pulses: 2+ symmetric, upper and lower extremities, normal cap refill Neurological: alert, oriented x 3, CN2-12 intact, strength normal upper extremities and lower extremities, sensation normal throughout,  DTRs 2+ throughout, no cerebellar signs, gait normal Psychiatric: normal affect, behavior normal, pleasant   Assessment:   1. Unspecified essential hypertension - at goal, however with new addition of doxazosin will cut atenolol in half and monitor BP -CBC with Differential - BASIC METABOLIC PANEL WITH GFR - Hepatic function panel - TSH  2. ASCVD (arteriosclerotic cardiovascular disease)  3. Mixed hyperlipidemia - Lipid panel  4. Other abnormal glucose - Hemoglobin A1c - Insulin, fasting  5. Unspecified vitamin D deficiency - check level  6. Morbid obesity - phentermine 37.5 QD  7. Acute cystitis VS BPH - continue doxazosin -Urinalysis, Routine w reflex microscopic - Urine culture  8. Encounter for long-term (current) use of other medications - Magnesium   Plan:   During the course of the visit the patient was educated and counseled about appropriate screening and preventive services including:    Pneumococcal vaccine   Td vaccine  Screening electrocardiogram  Colorectal cancer screening  Diabetes screening  Glaucoma screening  Nutrition counseling   Screening recommendations, referrals: Vaccinations: Tdap vaccine yes  Influenza vaccine not done Pneumococcal vaccine no Shingles vaccine yes Hep B vaccine no  Nutrition assessed and recommended  Colonoscopy yes DUE THIS YEAR Recommended yearly ophthalmology/optometry visit for glaucoma screening and checkup Recommended yearly dental visit for hygiene and checkup Advanced directives - yes  Conditions/risks identified: BMI: Discussed weight loss, diet, and increase physical activity.  Increase physical activity: AHA recommends 150 minutes of physical activity a week.  Medications reviewed Diabetes is at goal, ACE/ARB therapy: Yes. Urinary Incontinence is not an issue: discussed non pharmacology and pharmacology options.  Fall risk: moderate- discussed PT, home fall assessment, medications.   Hearing and vision evaluation- should be evaluated for hearing airs.  Colonoscopy  Medicare Attestation I have personally reviewed: The patient's medical and social history Their use of alcohol, tobacco or illicit drugs Their current medications and supplements The patient's functional ability including ADLs,fall risks, home safety risks, cognitive, and hearing and visual impairment Diet and physical activities Evidence for depression or mood disorders  The patient's weight, height, BMI, and visual acuity have been recorded in the chart.  I have made referrals, counseling, and provided education to the patient based on review of the above and I have provided the patient with a written personalized care plan for preventive services.     Vicie Mutters, PA-C   05/01/2013

## 2013-04-22 ENCOUNTER — Encounter: Payer: Self-pay | Admitting: Internal Medicine

## 2013-04-30 DIAGNOSIS — E559 Vitamin D deficiency, unspecified: Secondary | ICD-10-CM | POA: Insufficient documentation

## 2013-04-30 NOTE — Patient Instructions (Addendum)
Cut the atenolol in half Need colonoscopy Need shingles vaccine  Preventative Care for Adults, Male       REGULAR HEALTH EXAMS:  A routine yearly physical is a good way to check in with your primary care provider about your health and preventive screening. It is also an opportunity to share updates about your health and any concerns you have, and receive a thorough all-over exam.   Most health insurance companies pay for at least some preventative services.  Check with your health plan for specific coverages.  WHAT PREVENTATIVE SERVICES DO MEN NEED?  Adult men should have their weight and blood pressure checked regularly.   Men age 37 and older should have their cholesterol levels checked regularly.  Beginning at age 68 and continuing to age 78, men should be screened for colorectal cancer.  Certain people should may need continued testing until age 6.  Other cancer screening may include exams for testicular and prostate cancer.  Updating vaccinations is part of preventative care.  Vaccinations help protect against diseases such as the flu.  Lab tests are generally done as part of preventative care to screen for anemia and blood disorders, to screen for problems with the kidneys and liver, to screen for bladder problems, to check blood sugar, and to check your cholesterol level.  Preventative services generally include counseling about diet, exercise, avoiding tobacco, drugs, excessive alcohol consumption, and sexually transmitted infections.    GENERAL RECOMMENDATIONS FOR GOOD HEALTH:  Healthy diet:  Eat a variety of foods, including fruit, vegetables, animal or vegetable protein, such as meat, fish, chicken, and eggs, or beans, lentils, tofu, and grains, such as rice.  Drink plenty of water daily.  Decrease saturated fat in the diet, avoid lots of red meat, processed foods, sweets, fast foods, and fried foods.  Exercise:  Aerobic exercise helps maintain good heart health.  At least 30-40 minutes of moderate-intensity exercise is recommended. For example, a brisk walk that increases your heart rate and breathing. This should be done on most days of the week.   Find a type of exercise or a variety of exercises that you enjoy so that it becomes a part of your daily life.  Examples are running, walking, swimming, water aerobics, and biking.  For motivation and support, explore group exercise such as aerobic class, spin class, Zumba, Yoga,or  martial arts, etc.    Set exercise goals for yourself, such as a certain weight goal, walk or run in a race such as a 5k walk/run.  Speak to your primary care provider about exercise goals.  Disease prevention:  If you smoke or chew tobacco, find out from your caregiver how to quit. It can literally save your life, no matter how long you have been a tobacco user. If you do not use tobacco, never begin.   Maintain a healthy diet and normal weight. Increased weight leads to problems with blood pressure and diabetes.   The Body Mass Index or BMI is a way of measuring how much of your body is fat. Having a BMI above 27 increases the risk of heart disease, diabetes, hypertension, stroke and other problems related to obesity. Your caregiver can help determine your BMI and based on it develop an exercise and dietary program to help you achieve or maintain this important measurement at a healthful level.  High blood pressure causes heart and blood vessel problems.  Persistent high blood pressure should be treated with medicine if weight loss and exercise do not  work.   Fat and cholesterol leaves deposits in your arteries that can block them. This causes heart disease and vessel disease elsewhere in your body.  If your cholesterol is found to be high, or if you have heart disease or certain other medical conditions, then you may need to have your cholesterol monitored frequently and be treated with medication.   Ask if you should have a  stress test if your history suggests this. A stress test is a test done on a treadmill that looks for heart disease. This test can find disease prior to there being a problem.  Avoid drinking alcohol in excess (more than two drinks per day).  Avoid use of street drugs. Do not share needles with anyone. Ask for professional help if you need assistance or instructions on stopping the use of alcohol, cigarettes, and/or drugs.  Brush your teeth twice a day with fluoride toothpaste, and floss once a day. Good oral hygiene prevents tooth decay and gum disease. The problems can be painful, unattractive, and can cause other health problems. Visit your dentist for a routine oral and dental check up and preventive care every 6-12 months.   Look at your skin regularly.  Use a mirror to look at your back. Notify your caregivers of changes in moles, especially if there are changes in shapes, colors, a size larger than a pencil eraser, an irregular border, or development of new moles.  Safety:  Use seatbelts 100% of the time, whether driving or as a passenger.  Use safety devices such as hearing protection if you work in environments with loud noise or significant background noise.  Use safety glasses when doing any work that could send debris in to the eyes.  Use a helmet if you ride a bike or motorcycle.  Use appropriate safety gear for contact sports.  Talk to your caregiver about gun safety.  Use sunscreen with a SPF (or skin protection factor) of 15 or greater.  Lighter skinned people are at a greater risk of skin cancer. Don't forget to also wear sunglasses in order to protect your eyes from too much damaging sunlight. Damaging sunlight can accelerate cataract formation.   Practice safe sex. Use condoms. Condoms are used for birth control and to help reduce the spread of sexually transmitted infections (or STIs).  Some of the STIs are gonorrhea (the clap), chlamydia, syphilis, trichomonas, herpes, HPV (human  papilloma virus) and HIV (human immunodeficiency virus) which causes AIDS. The herpes, HIV and HPV are viral illnesses that have no cure. These can result in disability, cancer and death.   Keep carbon monoxide and smoke detectors in your home functioning at all times. Change the batteries every 6 months or use a model that plugs into the wall.   Vaccinations:  Stay up to date with your tetanus shots and other required immunizations. You should have a booster for tetanus every 10 years. Be sure to get your flu shot every year, since 5%-20% of the [place] population comes down with the flu. The flu vaccine changes each year, so being vaccinated once is not enough. Get your shot in the fall, before the flu season peaks.   Other vaccines to consider:  Pneumococcal vaccine to protect against certain types of pneumonia.  This is normally recommended for adults age 49 or older.  However, adults younger than 70 years old with certain underlying conditions such as diabetes, heart or lung disease should also receive the vaccine.  Shingles vaccine to protect against  Varicella Zoster if you are older than age 10, or younger than 70 years old with certain underlying illness.  Hepatitis A vaccine to protect against a form of infection of the liver by a virus acquired from food.  Hepatitis B vaccine to protect against a form of infection of the liver by a virus acquired from blood or body fluids, particularly if you work in health care.  If you plan to travel internationally, check with your local health department for specific vaccination recommendations.  Cancer Screening:  Most routine colon cancer screening begins at the age of 85. On a yearly basis, doctors may provide special easy to use take-home tests to check for hidden blood in the stool. Sigmoidoscopy or colonoscopy can detect the earliest forms of colon cancer and is life saving. These tests use a small camera at the end of a tube to directly examine  the colon. Speak to your caregiver about this at age 56, when routine screening begins (and is repeated every 5 years unless early forms of pre-cancerous polyps or small growths are found).   At the age of 3 men usually start screening for prostate cancer every year. Screening may begin at a younger age for those with higher risk. Those at higher risk include African-Americans or having a family history of prostate cancer. There are two types of tests for prostate cancer:   Prostate-specific antigen (PSA) testing. Recent studies raise questions about prostate cancer using PSA and you should discuss this with your caregiver.   Digital rectal exam (in which your doctor's lubricated and gloved finger feels for enlargement of the prostate through the anus).   Screening for testicular cancer.  Do a monthly exam of your testicles. Gently roll each testicle between your thumb and fingers, feeling for any abnormal lumps. The best time to do this is after a hot shower or bath when the tissues are looser. Notify your caregivers of any lumps, tenderness or changes in size or shape immediately.     Bad carbs also include fruit juice, alcohol, and sweet tea. These are empty calories that do not signal to your brain that you are full.   Please remember the good carbs are still carbs which convert into sugar. So please measure them out no more than 1/2-1 cup of rice, oatmeal, pasta, and beans.  Veggies are however free foods! Pile them on.   I like lean protein at every meal such as chicken, [place], pork chops, cottage cheese, etc. Just do not fry these meats and please center your meal around vegetable, the meats should be a side dish.   No all fruit is created equal. Please see the list below, the fruit at the bottom is higher in sugars than the fruit at the top

## 2013-05-01 ENCOUNTER — Encounter: Payer: Self-pay | Admitting: Physician Assistant

## 2013-05-01 ENCOUNTER — Ambulatory Visit (INDEPENDENT_AMBULATORY_CARE_PROVIDER_SITE_OTHER): Payer: Medicare Other | Admitting: Physician Assistant

## 2013-05-01 ENCOUNTER — Other Ambulatory Visit: Payer: Self-pay | Admitting: Physician Assistant

## 2013-05-01 VITALS — BP 110/68 | HR 60 | Temp 98.3°F | Resp 16 | Ht 69.5 in | Wt 264.0 lb

## 2013-05-01 DIAGNOSIS — N3000 Acute cystitis without hematuria: Secondary | ICD-10-CM

## 2013-05-01 DIAGNOSIS — E782 Mixed hyperlipidemia: Secondary | ICD-10-CM | POA: Diagnosis not present

## 2013-05-01 DIAGNOSIS — R7309 Other abnormal glucose: Secondary | ICD-10-CM | POA: Diagnosis not present

## 2013-05-01 DIAGNOSIS — I10 Essential (primary) hypertension: Secondary | ICD-10-CM | POA: Diagnosis not present

## 2013-05-01 DIAGNOSIS — E559 Vitamin D deficiency, unspecified: Secondary | ICD-10-CM

## 2013-05-01 DIAGNOSIS — Z0000 Encounter for general adult medical examination without abnormal findings: Secondary | ICD-10-CM

## 2013-05-01 DIAGNOSIS — I2510 Atherosclerotic heart disease of native coronary artery without angina pectoris: Secondary | ICD-10-CM | POA: Diagnosis not present

## 2013-05-01 DIAGNOSIS — Z79899 Other long term (current) drug therapy: Secondary | ICD-10-CM

## 2013-05-01 LAB — CBC WITH DIFFERENTIAL/PLATELET
Basophils Absolute: 0.1 10*3/uL (ref 0.0–0.1)
Basophils Relative: 1 % (ref 0–1)
Eosinophils Absolute: 0.2 10*3/uL (ref 0.0–0.7)
Eosinophils Relative: 4 % (ref 0–5)
HEMATOCRIT: 41.7 % (ref 39.0–52.0)
HEMOGLOBIN: 14.1 g/dL (ref 13.0–17.0)
LYMPHS PCT: 28 % (ref 12–46)
Lymphs Abs: 1.5 10*3/uL (ref 0.7–4.0)
MCH: 30.2 pg (ref 26.0–34.0)
MCHC: 33.8 g/dL (ref 30.0–36.0)
MCV: 89.3 fL (ref 78.0–100.0)
MONO ABS: 0.5 10*3/uL (ref 0.1–1.0)
MONOS PCT: 9 % (ref 3–12)
NEUTROS ABS: 3 10*3/uL (ref 1.7–7.7)
Neutrophils Relative %: 58 % (ref 43–77)
Platelets: 260 10*3/uL (ref 150–400)
RBC: 4.67 MIL/uL (ref 4.22–5.81)
RDW: 14.9 % (ref 11.5–15.5)
WBC: 5.2 10*3/uL (ref 4.0–10.5)

## 2013-05-01 LAB — HEMOGLOBIN A1C
HEMOGLOBIN A1C: 6.3 % — AB (ref <5.7)
Mean Plasma Glucose: 134 mg/dL — ABNORMAL HIGH (ref <117)

## 2013-05-01 MED ORDER — ATENOLOL 50 MG PO TABS
50.0000 mg | ORAL_TABLET | Freq: Every day | ORAL | Status: DC
Start: 2013-05-01 — End: 2014-02-08

## 2013-05-01 MED ORDER — PHENTERMINE HCL 37.5 MG PO TABS: 37.5000 mg | ORAL_TABLET | Freq: Every day | ORAL | Status: DC

## 2013-05-01 MED ORDER — SERTRALINE HCL 100 MG PO TABS: 200.0000 mg | ORAL_TABLET | Freq: Every day | ORAL | Status: DC

## 2013-05-01 MED ORDER — LISINOPRIL 20 MG PO TABS: ORAL_TABLET | ORAL | Status: DC

## 2013-05-01 MED ORDER — LEVETIRACETAM 500 MG PO TABS: ORAL_TABLET | ORAL | Status: DC

## 2013-05-01 MED ORDER — ATORVASTATIN CALCIUM 80 MG PO TABS
80.0000 mg | ORAL_TABLET | Freq: Every day | ORAL | Status: DC
Start: 2013-05-01 — End: 2015-07-25

## 2013-05-01 NOTE — Addendum Note (Signed)
Addended by: Vicie Mutters R on: 05/01/2013 12:40 PM   Modules accepted: Orders

## 2013-05-01 NOTE — Addendum Note (Signed)
Addended by: Vicie Mutters R on: 05/01/2013 12:55 PM   Modules accepted: Orders

## 2013-05-02 LAB — MAGNESIUM: Magnesium: 2 mg/dL (ref 1.5–2.5)

## 2013-05-02 LAB — HEPATIC FUNCTION PANEL
ALK PHOS: 45 U/L (ref 39–117)
ALT: 26 U/L (ref 0–53)
AST: 18 U/L (ref 0–37)
Albumin: 4.4 g/dL (ref 3.5–5.2)
BILIRUBIN INDIRECT: 0.4 mg/dL (ref 0.2–1.2)
Bilirubin, Direct: 0.1 mg/dL (ref 0.0–0.3)
TOTAL PROTEIN: 6.4 g/dL (ref 6.0–8.3)
Total Bilirubin: 0.5 mg/dL (ref 0.2–1.2)

## 2013-05-02 LAB — URINALYSIS, ROUTINE W REFLEX MICROSCOPIC
Bilirubin Urine: NEGATIVE
GLUCOSE, UA: NEGATIVE mg/dL
HGB URINE DIPSTICK: NEGATIVE
Ketones, ur: NEGATIVE mg/dL
LEUKOCYTES UA: NEGATIVE
Nitrite: NEGATIVE
PROTEIN: NEGATIVE mg/dL
Specific Gravity, Urine: 1.023 (ref 1.005–1.030)
UROBILINOGEN UA: 0.2 mg/dL (ref 0.0–1.0)
pH: 5.5 (ref 5.0–8.0)

## 2013-05-02 LAB — BASIC METABOLIC PANEL WITH GFR
BUN: 25 mg/dL — ABNORMAL HIGH (ref 6–23)
CHLORIDE: 102 meq/L (ref 96–112)
CO2: 29 meq/L (ref 19–32)
Calcium: 9.1 mg/dL (ref 8.4–10.5)
Creat: 0.92 mg/dL (ref 0.50–1.35)
GFR, Est African American: >89 mL/min
GFR, Est Non African American: 85 mL/min
Glucose, Bld: 117 mg/dL — ABNORMAL HIGH (ref 70–99)
Potassium: 4.7 mEq/L (ref 3.5–5.3)
Sodium: 137 mEq/L (ref 135–145)

## 2013-05-02 LAB — TSH: TSH: 1.256 u[IU]/mL (ref 0.350–4.500)

## 2013-05-02 LAB — INSULIN, FASTING: Insulin fasting, serum: 29 u[IU]/mL — ABNORMAL HIGH (ref 3–28)

## 2013-05-02 LAB — URINE CULTURE
Colony Count: NO GROWTH
Organism ID, Bacteria: NO GROWTH

## 2013-05-02 LAB — LIPID PANEL
Cholesterol: 112 mg/dL (ref 0–200)
HDL: 40 mg/dL (ref >39)
LDL CALC: 59 mg/dL (ref 0–99)
Total CHOL/HDL Ratio: 2.8 Ratio
Triglycerides: 66 mg/dL (ref <150)
VLDL: 13 mg/dL (ref 0–40)

## 2013-05-04 ENCOUNTER — Telehealth: Payer: Self-pay | Admitting: Physician Assistant

## 2013-05-04 NOTE — Telephone Encounter (Signed)
Patient told to increase water. His AIC is in prediabetic range which is between 5.7 and 6.4. This is a warning sign for diabetes. Your A1C is a measure of your sugar over the past 3 months and is not affected by what you have eaten over the past few days. Diabetes increases your chances of stroke and heart attack over 300 % and is the leading cause of blindness and kidney failure in the [place]. Please make sure you decrease bad carbs like white bread, white rice, potatoes, corn, soft drinks, pasta, cereals, refined sugars, sweet tea, dried fruits, and fruit juice. Good carbs are okay to eat in moderation like sweet potatoes, brown rice, whole grain pasta/bread, most fruit (expect dried fruit) and you can eat as many veggies as you want.

## 2013-05-12 ENCOUNTER — Other Ambulatory Visit: Payer: Self-pay | Admitting: Physician Assistant

## 2013-05-12 MED ORDER — DOXAZOSIN MESYLATE 8 MG PO TABS: 8.0000 mg | ORAL_TABLET | Freq: Every day | ORAL | Status: DC

## 2013-06-17 ENCOUNTER — Ambulatory Visit (INDEPENDENT_AMBULATORY_CARE_PROVIDER_SITE_OTHER): Payer: Medicare Other | Admitting: Internal Medicine

## 2013-06-17 ENCOUNTER — Encounter: Payer: Self-pay | Admitting: Internal Medicine

## 2013-06-17 VITALS — HR 64 | Temp 97.9°F | Resp 18 | Ht 69.5 in | Wt 264.6 lb

## 2013-06-17 DIAGNOSIS — I951 Orthostatic hypotension: Secondary | ICD-10-CM

## 2013-06-17 DIAGNOSIS — E559 Vitamin D deficiency, unspecified: Secondary | ICD-10-CM

## 2013-06-17 DIAGNOSIS — G40309 Generalized idiopathic epilepsy and epileptic syndromes, not intractable, without status epilepticus: Secondary | ICD-10-CM

## 2013-06-17 DIAGNOSIS — R5381 Other malaise: Secondary | ICD-10-CM | POA: Diagnosis not present

## 2013-06-17 DIAGNOSIS — R5383 Other fatigue: Secondary | ICD-10-CM

## 2013-06-17 DIAGNOSIS — Z79899 Other long term (current) drug therapy: Secondary | ICD-10-CM

## 2013-06-17 LAB — CBC WITH DIFFERENTIAL/PLATELET
BASOS ABS: 0.1 10*3/uL (ref 0.0–0.1)
BASOS PCT: 1 % (ref 0–1)
EOS ABS: 0.2 10*3/uL (ref 0.0–0.7)
Eosinophils Relative: 3 % (ref 0–5)
HEMATOCRIT: 42.3 % (ref 39.0–52.0)
Hemoglobin: 14.2 g/dL (ref 13.0–17.0)
Lymphocytes Relative: 24 % (ref 12–46)
Lymphs Abs: 1.5 10*3/uL (ref 0.7–4.0)
MCH: 30.5 pg (ref 26.0–34.0)
MCHC: 33.6 g/dL (ref 30.0–36.0)
MCV: 90.8 fL (ref 78.0–100.0)
MONOS PCT: 8 % (ref 3–12)
Monocytes Absolute: 0.5 10*3/uL (ref 0.1–1.0)
NEUTROS ABS: 4.1 10*3/uL (ref 1.7–7.7)
Neutrophils Relative %: 64 % (ref 43–77)
Platelets: 238 10*3/uL (ref 150–400)
RBC: 4.66 MIL/uL (ref 4.22–5.81)
RDW: 14.9 % (ref 11.5–15.5)
WBC: 6.4 10*3/uL (ref 4.0–10.5)

## 2013-06-17 NOTE — Progress Notes (Signed)
Patient ID: George Delgado, male   DOB: [birth year], 68 y.o.   MRN: [id number]    This very nice 68 y.o. DWM presents for follow up with Hypertension, ASCVD/CVA, Seizures 2 to CVA, Hyperlipidemia, Pre-Diabetes and Vitamin D Deficiency. Patient is reported by family members to have become lethargic, apathetic, withdrawn and very irritableover several weeks.   HTN predates since 1997. Patient does gave Hx/o a non-focal stroke in 2012. Today's BP is 96/85 Sitting & 81/46 standing. Patient denies any cardiac type chest pain, palpitations, dyspnea/orthopnea/PND, dizziness, claudication, or dependent edema.   Hyperlipidemia is controlled with diet & meds. Last Cholesterol was112, Triglycerides were 66, HDL 40 and LDL 59 in Feb 2015. Patient denies myalgias or other med SE's.    Also, the patient has history of PreDiabetes with A1c 6.0% in Nov 2012 and last A1c of 6.3% in Feb 2015. Patient denies any symptoms of reactive hypoglycemia, diabetic polys, paresthesias or visual blurring.   Further, Patient has history of Vitamin D Deficiency of 44 in 2012 and with last vitamin D of 85 in Aug 2014. Patient supplements vitamin D without any suspected side-effects.  Medication Sig  . aspirin 325 MG tablet Take 325 mg by mouth daily.  Marland Kitchen atenolol (TENORMIN) 50 MG tablet Take 1 tablet (50 mg total) by mouth daily.  Marland Kitchen atorvastatin (LIPITOR) 80 MG tablet Take 1 tablet (80 mg total) by mouth daily.  .  (SYMBICORT) 160-4.5  Inhale 2 puffs into the lungs 2 (two) times daily.  Marland Kitchen VITAMIN D) 1000 UNITS tablet  Take 8,000 IU daily.  Marland Kitchen doxazosin (CARDURA) 8 MG tablet Take 1 tablet (8 mg total) by mouth daily.  Marland Kitchen levETIRAcetam (KEPPRA) 500 MG  TAKE ONE TABLET BY MOUTH TWICE DAILY FOR  SEIZURES  . lisinopril (PRINIVIL,ZESTRIL) 20 MG tablet 1 tablet every morning for BP  . MULTIVITAMIN Take 1 tablet by mouth daily.  . phentermine (ADIPEX-P) 37.5 MG tablet 1/2 to 1 tablet each morning for appetite suppression and weight loss   . sertraline (ZOLOFT) 100 MG tablet Take 2 tablets (200 mg total) by mouth daily.    No Known Allergies  PMHx:   Past Medical History  Diagnosis Date  . Hypertension   . Stroke   . Hyperlipidemia   . COPD (chronic obstructive pulmonary disease)   . Depression     FHx:    Reviewed / unchanged  SHx:    Reviewed / unchanged   Systems Review: Constitutional: Denies fever, chills, wt changes, headaches, insomnia, fatigue, night sweats, change in appetite. Eyes: Denies redness, blurred vision, diplopia, discharge, itchy, watery eyes.  ENT: Denies discharge, congestion, post nasal drip, epistaxis, sore throat, earache, hearing loss, dental pain, tinnitus, vertigo, sinus pain, snoring.  CV: Denies chest pain, palpitations, irregular heartbeat, syncope, dyspnea, diaphoresis, orthopnea, PND, claudication, edema. Respiratory: denies cough, dyspnea, DOE, pleurisy, hoarseness, laryngitis, wheezing.  Gastrointestinal: Denies dysphagia, odynophagia, heartburn, reflux, water brash, abdominal pain or cramps, nausea, vomiting, bloating, diarrhea, constipation, hematemesis, melena, hematochezia,  or hemorrhoids. Genitourinary: Denies dysuria, frequency, urgency, nocturia, hesitancy, discharge, hematuria, flank pain. Musculoskeletal: Denies arthralgias, myalgias, stiffness, jt. swelling, pain, limp, strain/sprain.  Skin: Denies pruritus, rash, hives, warts, acne, eczema, change in skin lesion(s). Neuro: No weakness, tremor, incoordination, spasms, paresthesia, or pain. Psychiatric: Denies confusion, memory loss, or sensory loss. Endo: Denies change in weight, skin, hair change.  Heme/Lymph: No excessive bleeding, bruising, orenlarged lymph nodes.   Exam:  BP/sitting 96/85 w/P65 & BP/Standing  81/46 w/P 65   Temp 97.9  F   Resp 18  Ht 5' 9.5"   Wt 264 lb 9.6 oz   BMI 38.53 kg/m2  Appears well nourished - in no distress. Eyes: PERRLA, EOMs, conjunctiva no swelling or erythema. Sinuses: No  frontal/maxillary tenderness ENT/Mouth: EAC's clear, TM's nl w/o erythema, bulging. Nares clear w/o erythema, swelling, exudates. Oropharynx clear without erythema or exudates. Oral hygiene is good. Tongue normal, non obstructing. Hearing intact.  Neck: Supple. Thyroid nl. Car 2+/2+ without bruits, nodes or JVD. Chest: Respirations nl with BS clear & equal w/o rales, rhonchi, wheezing or stridor.  Cor: Heart sounds normal w/ regular rate and rhythm without sig. murmurs, gallops, clicks, or rubs. Peripheral pulses normal and equal  without edema.  Abdomen: Soft & bowel sounds normal. Non-tender w/o guarding, rebound, hernias, masses, or organomegaly.  Lymphatics: Unremarkable.  Musculoskeletal: Full ROM all peripheral extremities, joint stability, 5/5 strength, and normal gait.  Skin: Warm, dry without exposed rashes, lesions, ecchymosis apparent.  Neuro: Cranial nerves intact, reflexes equal bilaterally. Sensory-motor testing grossly intact. Tendon reflexes grossly intact.  Pysch: Alert & oriented x 3. Insight and judgement nl & appropriate. No ideations.  Assessment and Plan:  1. Hypertension - Overcompensated - Check labs to R/O anemia, dehydration, etc - & note EKG is normal. Cautioned regardin postural Sx's and encouraged fluids, etc. pending labs.  2. Hyperlipidemia - Continue diet/meds, exercise,& lifestyle modifications. Continue monitor periodic cholesterol/liver & renal functions   3. Pre-diabetes/Insulin Resistance - Continue diet, exercise, lifestyle modifications. Monitor appropriate labs.  4. Vitamin D Deficiency - Continue supplementation.  Recommended regular exercise, BP monitoring, weight control, and discussed med and SE's. Recommended labs to assess and monitor clinical status. Further disposition pending results of labs.

## 2013-06-17 NOTE — Patient Instructions (Signed)
Stop Atenolol & Lisinopril for now  Stay on 1/2 tablet Doxazosin (Cardura) at bedtime  Drink lots of liquids & "salty" things for now  Monitor BP's twice daily and keep a list  If BP rises over 145/95 then restart atenolo 50 mg at 1/2 tablet and if Bp stays up,   then re-add I/2 tablet of Lisinopril also   Hypotension As your heart beats, it forces blood through your arteries. This force is your blood pressure. If your blood pressure is too low for you to go about your normal activities or to support the organs of your body, you have hypotension. Hypotension is also referred to as low blood pressure. When your blood pressure becomes too low, you may not get enough blood to your brain. As a result, you may feel weak, feel lightheaded, or develop a rapid heart rate. In a more severe case, you may faint. CAUSES Various conditions can cause hypotension. These include:  Blood loss.  Dehydration.  Heart or endocrine problems.  Pregnancy.  Severe infection.  Not having a well-balanced diet filled with needed nutrients.  Severe allergic reactions (anaphylaxis). Some medicines, such as blood pressure medicine or water pills (diuretics), may lower your blood pressure below normal. Sometimes taking too much medicine or taking medicine not as directed can cause hypotension. TREATMENT  Hospitalization is sometimes required for hypotension if fluid or blood replacement is needed, if time is needed for medicines to wear off, or if further monitoring is needed. Treatment might include changing your diet, changing your medicines (including medicines aimed at raising your blood pressure), and use of support stockings. HOME CARE INSTRUCTIONS   Drink enough fluids to keep your urine clear or pale yellow.  Take your medicines as directed by your health care provider.  Get up slowly from reclining or sitting positions. This gives your blood pressure a chance to adjust.  Wear support stockings as  directed by your health care provider.  Maintain a healthy diet by including nutritious food, such as fruits, vegetables, nuts, whole grains, and lean meats. SEEK MEDICAL CARE IF:  You have vomiting or diarrhea.  You have a fever for more than 2 3 days.  You feel more thirsty than usual.  You feel weak and tired. SEEK IMMEDIATE MEDICAL CARE IF:   You have chest pain or a fast or irregular heartbeat.  You have a loss of feeling in some part of your body, or you lose movement in your arms or legs.  You have trouble speaking.  You become sweaty or feel lightheaded.  You faint. MAKE SURE YOU:   Understand these instructions.  Will watch your condition.  Will get help right away if you are not doing well or get worse. Document Released: 02/26/2005 Document Revised: 12/17/2012 Document Reviewed: 08/29/2012 [facility] Patient Information 2014 [place].   Hypotension As your heart beats, it forces blood through your arteries. This force is your blood pressure. If your blood pressure is too low for you to go about your normal activities or to support the organs of your body, you have hypotension. Hypotension is also referred to as low blood pressure. When your blood pressure becomes too low, you may not get enough blood to your brain. As a result, you may feel weak, feel lightheaded, or develop a rapid heart rate. In a more severe case, you may faint. CAUSES Various conditions can cause hypotension. These include:  Blood loss.  Dehydration.  Heart or endocrine problems.  Pregnancy.  Severe infection.  Not having a well-balanced diet filled with needed nutrients.  Severe allergic reactions (anaphylaxis). Some medicines, such as blood pressure medicine or water pills (diuretics), may lower your blood pressure below normal. Sometimes taking too much medicine or taking medicine not as directed can cause hypotension. TREATMENT  Hospitalization is sometimes required for  hypotension if fluid or blood replacement is needed, if time is needed for medicines to wear off, or if further monitoring is needed. Treatment might include changing your diet, changing your medicines (including medicines aimed at raising your blood pressure), and use of support stockings. HOME CARE INSTRUCTIONS   Drink enough fluids to keep your urine clear or pale yellow.  Take your medicines as directed by your health care provider.  Get up slowly from reclining or sitting positions. This gives your blood pressure a chance to adjust.  Wear support stockings as directed by your health care provider.  Maintain a healthy diet by including nutritious food, such as fruits, vegetables, nuts, whole grains, and lean meats. SEEK MEDICAL CARE IF:  You have vomiting or diarrhea.  You have a fever for more than 2 3 days.  You feel more thirsty than usual.  You feel weak and tired. SEEK IMMEDIATE MEDICAL CARE IF:   You have chest pain or a fast or irregular heartbeat.  You have a loss of feeling in some part of your body, or you lose movement in your arms or legs.  You have trouble speaking.  You become sweaty or feel lightheaded.  You faint. MAKE SURE YOU:   Understand these instructions.  Will watch your condition.  Will get help right away if you are not doing well or get worse. Document Released: 02/26/2005 Document Revised: 12/17/2012 Document Reviewed: 08/29/2012 [facility] Patient Information 2014 [place].

## 2013-06-18 LAB — BASIC METABOLIC PANEL WITH GFR
BUN: 35 mg/dL — AB (ref 6–23)
CALCIUM: 9.2 mg/dL (ref 8.4–10.5)
CO2: 26 mEq/L (ref 19–32)
Chloride: 101 mEq/L (ref 96–112)
Creat: 1.37 mg/dL — ABNORMAL HIGH (ref 0.50–1.35)
GFR, EST AFRICAN AMERICAN: 60 mL/min
GFR, EST NON AFRICAN AMERICAN: 52 mL/min — AB
GLUCOSE: 95 mg/dL (ref 70–99)
POTASSIUM: 4.7 meq/L (ref 3.5–5.3)
Sodium: 136 mEq/L (ref 135–145)

## 2013-06-18 LAB — CORTISOL: CORTISOL PLASMA: 9.6 ug/dL

## 2013-06-18 LAB — HEPATIC FUNCTION PANEL
ALT: 22 U/L (ref 0–53)
AST: 15 U/L (ref 0–37)
Albumin: 4.2 g/dL (ref 3.5–5.2)
Alkaline Phosphatase: 42 U/L (ref 39–117)
BILIRUBIN INDIRECT: 0.5 mg/dL (ref 0.2–1.2)
Bilirubin, Direct: 0.1 mg/dL (ref 0.0–0.3)
TOTAL PROTEIN: 6.3 g/dL (ref 6.0–8.3)
Total Bilirubin: 0.6 mg/dL (ref 0.2–1.2)

## 2013-06-18 LAB — MAGNESIUM: MAGNESIUM: 1.9 mg/dL (ref 1.5–2.5)

## 2013-06-18 LAB — TSH: TSH: 1.742 u[IU]/mL (ref 0.350–4.500)

## 2013-06-21 LAB — LEVETIRACETAM LEVEL: Keppra (Levetiracetam): 13.2 ug/mL

## 2013-07-01 ENCOUNTER — Ambulatory Visit: Payer: Self-pay | Admitting: Internal Medicine

## 2013-07-07 ENCOUNTER — Ambulatory Visit (INDEPENDENT_AMBULATORY_CARE_PROVIDER_SITE_OTHER): Payer: Medicare Other | Admitting: Internal Medicine

## 2013-07-07 ENCOUNTER — Encounter: Payer: Self-pay | Admitting: Internal Medicine

## 2013-07-07 VITALS — BP 116/70 | HR 60 | Temp 98.1°F | Resp 18 | Ht 69.5 in | Wt 265.4 lb

## 2013-07-07 DIAGNOSIS — I951 Orthostatic hypotension: Secondary | ICD-10-CM | POA: Diagnosis not present

## 2013-07-07 DIAGNOSIS — Z79899 Other long term (current) drug therapy: Secondary | ICD-10-CM

## 2013-07-07 DIAGNOSIS — I10 Essential (primary) hypertension: Secondary | ICD-10-CM | POA: Diagnosis not present

## 2013-07-07 NOTE — Progress Notes (Signed)
   Subjective:    Patient ID: George Delgado, male    DOB: [birth year], 68 y.o.   MRN: [id number]  HPI Patient returns today for follow up of symptomatic postural Hypotension felt in part med related and in part volume related.  At last visit his lisinopril was d/c'd and his Doxazocin and Atenolol doses were cut in half. Labs confirmed a prerenal azotemia and increased fluid intake was encouraged. Home monitoring of BP's found BP's rising and he appropriately increased his Atenolol back to one whole tablet with BP normalizing since. CV systems review is negative with resolution of his orthostatic Sx's.  Medication Sig  . aspirin 325 MG tablet Take 325 mg by mouth daily.  Marland Kitchen atenolol (TENORMIN) 50 MG tablet Take 1 tablet  daily.  Marland Kitchen atorvastatin (LIPITOR) 80 MG tablet Take 1/2  tablet (40 mg total) by mouth daily.  . cholecalciferol (VITAMIN D) 1000 UNITS tablet  Take 8,000 IU daily.  Marland Kitchen doxazosin (CARDURA) 8 MG tablet Take 1/2  tablet (8 mg total) by mouth daily.  Marland Kitchen levETIRAcetam (KEPPRA) 500 MG tablet TAKE ONE TABLET BY MOUTH TWICE DAILY FOR  SEIZURES  . Multiple Vitamin  tablet Take 1 tablet by mouth daily.  . phentermine (ADIPEX-P) 37.5 MG tablet 1/2 to 1 tablet  for appetite suppression and weight loss (started yesterday)  . sertraline (ZOLOFT) 100 MG tablet Take 2 tablets (200 mg total) by mouth daily.   No Known Allergies  Past Medical History  Diagnosis Date  . Hypertension   . Stroke   . Hyperlipidemia   . COPD (chronic obstructive pulmonary disease)   . Depression    Review of Systems In addition to the HPI above,  No Fever-chills,  No Headache, No changes with Vision or hearing,  No problems swallowing food or Liquids,  No Chest pain or productive Cough or Shortness of Breath,  No Abdominal pain, No Nausea or Vommitting, Bowel movements are regular,  No Blood in stool or Urine,  No dysuria,  No new skin rashes or bruises,  No new joints pains-aches,  No new weakness,  tingling, numbness in any extremity,  No recent weight loss,  No polyuria, polydypsia or polyphagia,  No significant Mental Stressors.  A full 10 point Review of Systems was done, except as stated above, all other Review of Systems were negative    Objective:   Physical Exam  BP 116/70  Pulse 60  Temp 98.1 F   Resp 18  Ht 5' 9.5"   Wt 265 lb 6.4 oz   BMI 38.64 kg/m2 Sitting BP 116/70 and standing BP 116/76  HEENT - Eac's patent. TM's Nl.EOM's full. PERRLA. NasoOroPharynx clear. Neck - supple. Nl Thyroid. No bruits nodes JVD Chest - Clear equal BS Cor - Nl HS. RRR w/o sig MGR. PP 1(+) No edema. Abd - No palpable organomegaly, masses or tenderness. BS nl. MS- FROM. w/o deformities. Muscle power tone and bulk Nl. Gait Nl. Neuro - No obvious Cr N abnormalities. Sensory, motor and Cerebellar functions appear Nl w/o focal abnormalities.  Assessment & Plan:   1- HTN, controlled   2- Orthostatic Hypotension, resolved

## 2013-07-31 ENCOUNTER — Ambulatory Visit: Payer: Self-pay | Admitting: Physician Assistant

## 2013-07-31 ENCOUNTER — Ambulatory Visit: Payer: Self-pay | Admitting: Internal Medicine

## 2013-10-06 ENCOUNTER — Ambulatory Visit: Payer: Self-pay | Admitting: Emergency Medicine

## 2013-10-12 ENCOUNTER — Encounter: Payer: Self-pay | Admitting: Internal Medicine

## 2013-10-12 ENCOUNTER — Ambulatory Visit (INDEPENDENT_AMBULATORY_CARE_PROVIDER_SITE_OTHER): Payer: Medicare Other | Admitting: Internal Medicine

## 2013-10-12 VITALS — BP 132/80 | HR 72 | Temp 99.1°F | Resp 18 | Ht 69.5 in | Wt 260.8 lb

## 2013-10-12 DIAGNOSIS — E782 Mixed hyperlipidemia: Secondary | ICD-10-CM | POA: Diagnosis not present

## 2013-10-12 DIAGNOSIS — E559 Vitamin D deficiency, unspecified: Secondary | ICD-10-CM

## 2013-10-12 DIAGNOSIS — I10 Essential (primary) hypertension: Secondary | ICD-10-CM | POA: Diagnosis not present

## 2013-10-12 DIAGNOSIS — Z79899 Other long term (current) drug therapy: Secondary | ICD-10-CM

## 2013-10-12 DIAGNOSIS — R7309 Other abnormal glucose: Secondary | ICD-10-CM

## 2013-10-12 LAB — CBC WITH DIFFERENTIAL/PLATELET
Basophils Absolute: 0.1 10*3/uL (ref 0.0–0.1)
Basophils Relative: 1 % (ref 0–1)
Eosinophils Absolute: 0.2 10*3/uL (ref 0.0–0.7)
Eosinophils Relative: 3 % (ref 0–5)
HCT: 41.6 % (ref 39.0–52.0)
Hemoglobin: 14.5 g/dL (ref 13.0–17.0)
Lymphocytes Relative: 30 % (ref 12–46)
Lymphs Abs: 1.6 10*3/uL (ref 0.7–4.0)
MCH: 30.3 pg (ref 26.0–34.0)
MCHC: 34.9 g/dL (ref 30.0–36.0)
MCV: 86.8 fL (ref 78.0–100.0)
MONO ABS: 0.5 10*3/uL (ref 0.1–1.0)
MONOS PCT: 9 % (ref 3–12)
NEUTROS ABS: 3.1 10*3/uL (ref 1.7–7.7)
Neutrophils Relative %: 57 % (ref 43–77)
Platelets: 270 10*3/uL (ref 150–400)
RBC: 4.79 MIL/uL (ref 4.22–5.81)
RDW: 14.4 % (ref 11.5–15.5)
WBC: 5.4 10*3/uL (ref 4.0–10.5)

## 2013-10-12 LAB — HEPATIC FUNCTION PANEL
ALK PHOS: 46 U/L (ref 39–117)
ALT: 20 U/L (ref 0–53)
AST: 14 U/L (ref 0–37)
Albumin: 4.2 g/dL (ref 3.5–5.2)
Bilirubin, Direct: 0.1 mg/dL (ref 0.0–0.3)
Indirect Bilirubin: 0.3 mg/dL (ref 0.2–1.2)
TOTAL PROTEIN: 6.2 g/dL (ref 6.0–8.3)
Total Bilirubin: 0.4 mg/dL (ref 0.2–1.2)

## 2013-10-12 LAB — BASIC METABOLIC PANEL WITH GFR
BUN: 22 mg/dL (ref 6–23)
CHLORIDE: 103 meq/L (ref 96–112)
CO2: 26 mEq/L (ref 19–32)
Calcium: 8.9 mg/dL (ref 8.4–10.5)
Creat: 0.96 mg/dL (ref 0.50–1.35)
GFR, EST NON AFRICAN AMERICAN: 80 mL/min
GFR, Est African American: >89 mL/min
Glucose, Bld: 98 mg/dL (ref 70–99)
Potassium: 4.3 mEq/L (ref 3.5–5.3)
Sodium: 139 mEq/L (ref 135–145)

## 2013-10-12 LAB — LIPID PANEL
CHOL/HDL RATIO: 2.4 ratio
Cholesterol: 103 mg/dL (ref 0–200)
HDL: 43 mg/dL (ref >39)
LDL CALC: 48 mg/dL (ref 0–99)
TRIGLYCERIDES: 59 mg/dL (ref <150)
VLDL: 12 mg/dL (ref 0–40)

## 2013-10-12 LAB — HEMOGLOBIN A1C
Hgb A1c MFr Bld: 6 % — ABNORMAL HIGH (ref <5.7)
MEAN PLASMA GLUCOSE: 126 mg/dL — AB (ref <117)

## 2013-10-12 LAB — TSH: TSH: 1.638 u[IU]/mL (ref 0.350–4.500)

## 2013-10-12 LAB — MAGNESIUM: Magnesium: 1.9 mg/dL (ref 1.5–2.5)

## 2013-10-12 MED ORDER — ALPRAZOLAM ER 0.5 MG PO TB24: ORAL_TABLET | ORAL | Status: DC

## 2013-10-12 NOTE — Patient Instructions (Signed)

## 2013-10-12 NOTE — Progress Notes (Signed)
Patient ID: George Delgado, male   DOB: 07/01/1945, 68 y.o.   MRN: [id number]   This very nice 68 y.o.male presents for 3 month follow up with Hypertension, Hyperlipidemia, Pre-Diabetes and Vitamin D Deficiency. Patient also has hx/o seizure disorder and has been controlled with Keppra. He had an episode of near syncope and falling w/o LOC or noted seizure activity. Family also reports he is very irritable sometimes "Yelling" at his wife. He did experience sedation intolerance w/Xanax ER 1/2 & 1 mg doses in the past. Also patient has OSA & is not compliant with his CPAP.   Patient is treated for HTN & BP has been controlled and today's BP: 132/80 mmHg. Patient denies any cardiac type chest pain, palpitations, dyspnea/orthopnea/PND, dizziness, claudication, or dependent edema.   Hyperlipidemia is controlled with diet & meds. Patient denies myalgias or other med SE's. Last Lipids were 05/01/2013: Cholesterol, Total 112; HDL Cholesterol by NMR 40; LDL (calc) 59; Triglycerides 66   Also, the patient has history of PreDiabetes and patient denies any symptoms of reactive hypoglycemia, diabetic polys, paresthesias or visual blurring.  Last A1c was 05/01/2013: Hemoglobin-A1c 6.3*     Further, Patient has history of Vitamin D Deficiency and patient supplements vitamin D without any suspected side-effects. Last vitamin D was 85 in Aug 2015.    Medication List   aspirin 325 MG tablet  Take 325 mg by mouth daily.     atenolol 50 MG tablet  Commonly known as:  TENORMIN  Take 1 tablet (50 mg total) by mouth daily.     atorvastatin 80 MG tablet  Commonly known as:  LIPITOR  Take 1 tablet (80 mg total) by mouth daily.     budesonide-formoterol 160-4.5 MCG/ACT inhaler  Commonly known as:  SYMBICORT  Inhale 2 puffs into the lungs 2 (two) times daily.     cholecalciferol 1000 UNITS tablet  Commonly known as:  VITAMIN D  Take 1,000 Units by mouth daily. Take 8,000 IU daily.     doxazosin 8 MG tablet   Commonly known as:  CARDURA  Take 1 tablet (8 mg total) by mouth daily.     levETIRAcetam 500 MG tablet  Commonly known as:  KEPPRA  TAKE ONE TABLET BY MOUTH TWICE DAILY FOR  SEIZURES     multivitamin tablet  Take 1 tablet by mouth daily.     phentermine 37.5 MG tablet  Commonly known as:  ADIPEX-P  1/2 to 1 tablet each morning for appetite suppression and weight loss     sertraline 100 MG tablet  Commonly known as:  ZOLOFT  Take 2 tablets (200 mg total) by mouth daily.       No Known Allergies PMHx:   Past Medical History  Diagnosis Date  . Hypertension   . Stroke   . Hyperlipidemia   . COPD (chronic obstructive pulmonary disease)   . Depression    FHx:    Reviewed / unchanged SHx:    Reviewed / unchanged  Systems Review:  Constitutional: Denies fever, chills, wt changes, headaches, insomnia, fatigue, night sweats, change in appetite. Eyes: Denies redness, blurred vision, diplopia, discharge, itchy, watery eyes.  ENT: Denies discharge, congestion, post nasal drip, epistaxis, sore throat, earache, hearing loss, dental pain, tinnitus, vertigo, sinus pain, snoring.  CV: Denies chest pain, palpitations, irregular heartbeat, syncope, dyspnea, diaphoresis, orthopnea, PND, claudication or edema. Respiratory: denies cough, dyspnea, DOE, pleurisy, hoarseness, laryngitis, wheezing.  Gastrointestinal: Denies dysphagia, odynophagia, heartburn, reflux, water brash, abdominal pain or cramps,  nausea, vomiting, bloating, diarrhea, constipation, hematemesis, melena, hematochezia  or hemorrhoids. Genitourinary: Denies dysuria, frequency, urgency, nocturia, hesitancy, discharge, hematuria or flank pain. Musculoskeletal: Denies arthralgias, myalgias, stiffness, jt. swelling, pain, limping or strain/sprain.  Skin: Denies pruritus, rash, hives, warts, acne, eczema or change in skin lesion(s). Neuro: No weakness, tremor, incoordination, spasms, paresthesia or pain. Psychiatric: Denies  confusion, memory loss or sensory loss. Endo: Denies change in weight, skin or hair change.  Heme/Lymph: No excessive bleeding, bruising or enlarged lymph nodes.  Exam:  BP 132/80  Pulse 72  Temp(Src) 99.1 F (37.3 C) (Temporal)  Resp 18  Ht 5' 9.5" (1.765 m)  Wt 260 lb 12.8 oz (118.298 kg)  BMI 37.97 kg/m2  Appears well nourished and in no distress. Eyes: PERRLA, EOMs, conjunctiva no swelling or erythema. Sinuses: No frontal/maxillary tenderness ENT/Mouth: EAC's clear, TM's nl w/o erythema, bulging. Nares clear w/o erythema, swelling, exudates. Oropharynx clear without erythema or exudates. Oral hygiene is good. Tongue normal, non obstructing. Hearing intact.  Neck: Supple. Thyroid nl. Car 2+/2+ without bruits, nodes or JVD. Chest: Respirations nl with BS clear & equal w/o rales, rhonchi, wheezing or stridor.  Cor: Heart sounds normal w/ regular rate and rhythm without sig. murmurs, gallops, clicks, or rubs. Peripheral pulses normal and equal  without edema.  Abdomen: Soft & bowel sounds normal. Non-tender w/o guarding, rebound, hernias, masses, or organomegaly.  Lymphatics: Unremarkable.  Musculoskeletal: Full ROM all peripheral extremities, joint stability, 5/5 strength, and normal gait.  Skin: Warm, dry without exposed rashes, lesions or ecchymosis apparent.  Neuro: Cranial nerves intact, reflexes equal bilaterally. Sensory-motor testing grossly intact. Tendon reflexes grossly intact.  Pysch: Alert & oriented x 3. Insight and judgement nl & appropriate. No ideations.  Assessment and Plan:  1. Hypertension - Continue monitor blood pressure at home. Continue diet/meds same.  2. Hyperlipidemia - Continue diet/meds, exercise,& lifestyle modifications. Continue monitor periodic cholesterol/liver & renal functions   3. Pre-Diabetes - Continue diet, exercise, lifestyle modifications. Monitor appropriate labs.  4. Vitamin D Deficiency - Continue supplementation.  5. Seizure  Disorder  6. OSA off CPAP  Recommended regular exercise, BP monitoring, weight control, and discussed med and SE's. Recommended labs to assess and monitor clinical status. Further disposition pending results of labs. Will try Alprazolam 0.5 mg ER & recc begin w/1/2 tab

## 2013-10-13 LAB — INSULIN, FASTING: Insulin fasting, serum: 18 u[IU]/mL (ref 3–28)

## 2013-10-13 LAB — VITAMIN D 25 HYDROXY (VIT D DEFICIENCY, FRACTURES): Vit D, 25-Hydroxy: 79 ng/mL (ref 30–89)

## 2013-10-14 LAB — LEVETIRACETAM LEVEL: Keppra (Levetiracetam): 11.3 ug/mL

## 2013-10-18 ENCOUNTER — Other Ambulatory Visit: Payer: Self-pay | Admitting: Physician Assistant

## 2013-10-18 MED ORDER — PERMETHRIN 5 % EX CREA: 1.0000 "application " | TOPICAL_CREAM | Freq: Once | CUTANEOUS | Status: DC

## 2013-10-30 ENCOUNTER — Ambulatory Visit: Payer: Self-pay | Admitting: Physician Assistant

## 2013-11-10 ENCOUNTER — Other Ambulatory Visit: Payer: Self-pay | Admitting: Physician Assistant

## 2013-11-10 MED ORDER — MOXIFLOXACIN HCL 0.5 % OP SOLN: 1.0000 [drp] | Freq: Three times a day (TID) | OPHTHALMIC | Status: DC

## 2013-11-10 MED ORDER — DICLOFENAC SODIUM 0.1 % OP SOLN: 1.0000 [drp] | Freq: Four times a day (QID) | OPHTHALMIC | Status: DC

## 2013-11-15 ENCOUNTER — Other Ambulatory Visit: Payer: Self-pay | Admitting: Physician Assistant

## 2013-11-15 MED ORDER — AZITHROMYCIN 250 MG PO TABS: ORAL_TABLET | ORAL | Status: AC

## 2014-02-01 ENCOUNTER — Encounter: Payer: Self-pay | Admitting: Internal Medicine

## 2014-02-05 NOTE — Patient Instructions (Addendum)

## 2014-02-05 NOTE — Progress Notes (Signed)
Patient ID: George Delgado, male   DOB: 03-Oct-1945, 68 y.o.   MRN: [id number]  [facility] VISIT AND CPE  Assessment:   1. Essential hypertension  - Microalbumin / creatinine urine ratio - [place], RETROPERITNL ABD,  LTD - TSH  2. Hyperlipidemia  - Lipid panel  3. Prediabetes  - Hemoglobin A1c - Insulin, fasting  4. Vitamin D deficiency  - Vit D  25 hydroxy (rtn osteoporosis monitoring)  5. OSA on CPAP   6. Generalized convulsive epilepsy  - Levetiracetam level  7. Medication management  - Urine Microscopic - CBC with Differential - BASIC METABOLIC PANEL WITH GFR - Hepatic function panel - Magnesium  8. Screening for prostate cancer - PSA  9. Screening for rectal cancer  - POC Hemoccult Bld/Stl (3-Cd Home Screen); Future  10. Depression screen   11. At low risk for fall   12. ASCVD w/ NonFocal CVA (2012)    13. Atrial Fibrillation, Paroxysmal   14. Fatigue   - recommended diet, exercise & weight loss.  Plan:   During the course of the visit the patient was educated and counseled about appropriate screening and preventive services including:    Pneumococcal vaccine   Influenza vaccine  Td vaccine  Screening electrocardiogram  Bone densitometry screening  Colorectal cancer screening  Diabetes screening  Glaucoma screening  Nutrition counseling   Advanced directives: requested  Screening recommendations, referrals: Vaccinations: DT vaccine 2008 Influenza vaccine HD [facility]10[facility]2013 at [facility] Pneumococcal vaccine 2011 Prevnar vaccine 10/10/2013 at [place] vaccine undecided Hep B vaccine not indicated  Nutrition assessed and recommended  Colonoscopy 12/2013 at [facility] recommending 3 yr f/u Recommended yearly ophthalmology/optometry visit for glaucoma screening and checkup Recommended yearly dental visit for hygiene and checkup Advanced directives - yes  Conditions/risks identified: BMI: Discussed weight loss, diet, and  increase physical activity.  Increase physical activity: AHA recommends 150 minutes of physical activity a week.  Medications reviewed PreDiabetes is at goal, ACE/ARB therapy: Not Indicatede Urinary Incontinence is not an issue: discussed non pharmacology and pharmacology options.  Fall risk: low- discussed PT, home fall assessment, medications.    Subjective:  George Delgado is a 68 y.o. MWM who presents for Medicare Annual Wellness Visit and complete physical.  Date of last medicare wellness visit is unknown.  Patient recently was hospitalized at his [place] in [place] with rapid Atrial fibrillation and dx'd with pAfib now controlled with low dose metoprolol and he's also on Xarelto thru the  [place] for stroke prophylaxis. Allegedly he had a negative 2D cardiac echo and cardiac scan. Family reports he becomes dyspneic a low level of activity which patient denies or minimizes, specifically denying any exertional CP and denies any irregular or rapid palpitations since on his current med regimen.  He has had elevated blood pressure since 1997. His blood pressure has been controlled at home, today their BP is BP: 116/78 mmHg. In 2012 he had a non focal CVA. He also has hx/o seizure disorder controlled on Keppra & felt consequent of his CVA.  He does not workout. He denies chest pain, shortness of breath, dizziness.   He is on cholesterol medication and denies myalgias. His cholesterol is at goal. The cholesterol last visit was:  Lab Results  Component Value Date   CHOL 103 10/12/2013   HDL 43 10/12/2013   LDLCALC 48 10/12/2013   TRIG 59 10/12/2013   CHOLHDL 2.4 10/12/2013   He has had pre diabetes for 4years since Nov 2011 with  A1c 6.0%. He has not been working on diet and has gained 25 # in the last 2 years. He does not exercise and denies foot ulcerations, hyperglycemia, paresthesia of the feet, polydipsia, polyuria and visual disturbances. Last A1C in the office was:   Lab Results   Component Value Date   HGBA1C 6.0* 10/12/2013   Patient is on Vitamin D supplement.  (Vit 44 in 2012) Lab Results  Component Value Date   VD25OH 79 10/12/2013     Names of Other Physician/Practitioners you currently use: 1. [facility] here for primary care 2. [facility] 3. No dentist, last visit [place].  Patient Care Team: George Pinto, MD as PCP - General (Internal Medicine)  Medication Review: Medication Sig  . atenolol  50 MG tab Take 1 tablet  daily.  Marland Kitchen atorvastatin 80 MG tab Take 1 tablet  daily.  . SYMBICORT 160-4.5  Inhale 2 puffs into the lungs 2  times daily.  Marland Kitchen VITAMIN D 1000 U  Take 8,000 IU daily.  . diclofenac  0.1 % opth son Place 1 drop into the left eye 4 (four) times daily.  Marland Kitchen doxazosin 8 MG tablet Take 1 tablet (8 mg total) by mouth daily.  Marland Kitchen levETIRAcetam 500 MG ta TAKE ONE TABLET BY MOUTH TWICE DAILY FOR  SEIZURES  . moxifloxacin (VIGAMOX) 0.5 % ophthalmic solution Place 1 drop into the left eye 3 (three) times daily.  . MULTIVITAMIN tab Take 1 tablet by mouth daily.  . sertraline 100 MG tablet Take 2 tablets (200 mg total) by mouth daily.   Current Problems (verified)  Screening Tests Health Maintenance  Topic Date Due  . ZOSTAVAX  07/08/2003  . INFLUENZA VACCINE  10/10/2013  . TETANUS/TDAP  03/13/2016  . COLONOSCOPY  06/03/2016  . PNEUMOCOCCAL POLYSACCHARIDE VACCINE AGE 70 AND OVER  Completed    Immunization History  Administered Date(s) Administered  . Influenza Whole 01/19/2011, 01/22/2012  . Influenza-Unspecified 12/28/2012, 12/10/2013  . Pneumococcal Conjugate-13 10/10/2013  . Pneumococcal-Unspecified 03/12/2009  . Td 03/12/2006   Preventative care: Last colonoscopy: 12/2013 - rec 3 yr f/u.  Prior vaccinations: TD: 2008  Influenza: HD 12/10/2013  Pneumococcal: 2011 Shingles/Zostavax: Declined  History reviewed: allergies, current medications, past family history, past  medical history, past social history, past surgical history and problem list  Risk Factors: Tobacco History  Substance Use Topics  . Smoking status: Former Smoker    Quit date: 05/11/2010  . Smokeless tobacco: Never Used  . Alcohol Use: No     Comment: rarely   He does not smoke.  Patient is a former smoker. Are there smokers in your home (other than you)?  No  Alcohol Current alcohol use: none  Caffeine Current caffeine use: coffee 1-2 /day  Exercise Current exercise: housecleaning, walking and yard work  Nutrition/Diet Current diet: in general, an "unhealthy" diet  Cardiac risk factors: advanced age (older than 45 for men, 48 for women), dyslipidemia, hypertension and male gender.  Depression Screen (Note: if answer to either of the following is "Yes", a more complete depression screening is indicated)   Q1: Over the past two weeks, have you felt down, depressed or hopeless? No  Q2: Over the past two weeks, have you felt little interest or pleasure in doing things? No  Have you lost interest or pleasure in daily life? No  Do you often feel hopeless? No  Do you cry easily over simple problems? No  Activities of Daily Living In your present state of health, do you have any difficulty performing the following activities?:  Driving? No Managing money?  No Feeding yourself? No Getting from bed to chair? No Climbing a flight of stairs? No Preparing food and eating?: No Bathing or showering? No Getting dressed: No Getting to the toilet? No Using the toilet:No Moving around from place to place: No In the past year have you fallen or had a near fall?:No   Are you sexually active?  No  Do you have more than one partner?  No  Vision Difficulties: No  Hearing Difficulties: No Do you often ask people to speak up or repeat themselves? No Do you experience ringing or noises in your ears? No Do you have difficulty understanding soft or whispered voices?  No  Cognition  Do you feel that you have a problem with memory?No  Do you often misplace items? No  Do you feel safe at home?  Yes  Advanced directives Does patient have a [place]? Yes Does patient have a Living Will? Yes  In addition to the HPI above,  No Fever-chills,  No Headache, No changes with Vision or hearing,  No problems swallowing food or Liquids,  No Chest pain or productive Cough,  No Abdominal pain, No Nausea or Vomitting, Bowel movements are regular,  No Blood in stool or Urine,  No dysuria,  No new skin rashes or bruises,  No new joints pains-aches,  No new weakness, tingling, numbness in any extremity,  Has 25# weight gain last 2 years,  No polyuria, polydypsia or polyphagia,  No significant Mental Stressors.  A full 10 point Review of Systems was done, except as stated above, all other Review of Systems were negative  Objective:     Blood pressure 116/78, pulse 72, temperature 99 F (37.2 C), resp. rate 18, height 5' 9.5" (1.765 m), weight 268 lb 3.2 oz (121.655 kg). Body mass index is 39.05 kg/(m^2).  General appearance: alert, no distress, WD/WN, male Cognitive Testing  Alert? Yes  Normal Appearance? Yes  Oriented to person? Yes  Place? Yes   Time? Yes  Recall of three objects?  Yes  Can perform simple calculations? Yes  Displays appropriate judgment? Yes  Can read the correct time from a watch/clock? Yes  Baseline O2 Sat 96% on Rm Air at rest and after walkin ~100 feet briskly, O2 stat remained stable at 95%.  HEENT: normocephalic, sclerae anicteric, TMs pearly, nares patent, no discharge or erythema, pharynx normal Oral cavity: MMM, no lesions Neck: supple, no lymphadenopathy, no thyromegaly, no masses Heart: RRR w/o significant irregularity noted, distant S1, S2, no murmurs. 1 (+) ankle/pedal edema [place]: Severe Kyphosis gibbous type deformity Lungs: CTA bilaterally, no wheezes, rhonchi, or rales Abdomen: +bs, soft, non  tender, non distended, no masses, no hepatomegaly, no splenomegaly Musculoskeletal: nontender, no swelling, no obvious deformity Extremities: no edema, no cyanosis, no clubbing Pulses: 1+ symmetric, upper and lower extremities, normal cap refill Neurological: alert, oriented x 3, CN2-12 intact, strength normal upper extremities and lower extremities, sensation normal throughout, DTRs 2+ throughout, no cerebellar signs, gait normal Psychiatric: normal affect, behavior normal, pleasant   Medicare Attestation I have personally reviewed: The patient's medical and social history Their use of alcohol, tobacco or illicit drugs Their current medications and supplements The patient's functional ability including ADLs,fall risks, home safety risks, cognitive, and hearing and visual impairment Diet and physical activities Evidence for depression or mood disorders  The patient's weight, height, BMI, and visual acuity have been recorded in the chart.  I have made referrals, counseling, and provided education to the patient based on review of the above and I have provided the patient with a written personalized care plan for preventive services.    George Delgado,George DAVID, MD   02/08/2014

## 2014-02-08 ENCOUNTER — Encounter: Payer: Self-pay | Admitting: Internal Medicine

## 2014-02-08 ENCOUNTER — Ambulatory Visit (INDEPENDENT_AMBULATORY_CARE_PROVIDER_SITE_OTHER): Payer: Medicare Other | Admitting: Internal Medicine

## 2014-02-08 VITALS — BP 116/78 | HR 72 | Temp 99.0°F | Resp 18 | Ht 69.5 in | Wt 268.2 lb

## 2014-02-08 DIAGNOSIS — R6889 Other general symptoms and signs: Secondary | ICD-10-CM | POA: Diagnosis not present

## 2014-02-08 DIAGNOSIS — E559 Vitamin D deficiency, unspecified: Secondary | ICD-10-CM

## 2014-02-08 DIAGNOSIS — G4733 Obstructive sleep apnea (adult) (pediatric): Secondary | ICD-10-CM

## 2014-02-08 DIAGNOSIS — I10 Essential (primary) hypertension: Secondary | ICD-10-CM

## 2014-02-08 DIAGNOSIS — E782 Mixed hyperlipidemia: Secondary | ICD-10-CM

## 2014-02-08 DIAGNOSIS — Z1331 Encounter for screening for depression: Secondary | ICD-10-CM

## 2014-02-08 DIAGNOSIS — Z79899 Other long term (current) drug therapy: Secondary | ICD-10-CM | POA: Diagnosis not present

## 2014-02-08 DIAGNOSIS — G40309 Generalized idiopathic epilepsy and epileptic syndromes, not intractable, without status epilepticus: Secondary | ICD-10-CM | POA: Diagnosis not present

## 2014-02-08 DIAGNOSIS — Z9989 Dependence on other enabling machines and devices: Secondary | ICD-10-CM

## 2014-02-08 DIAGNOSIS — Z1212 Encounter for screening for malignant neoplasm of rectum: Secondary | ICD-10-CM

## 2014-02-08 DIAGNOSIS — R7309 Other abnormal glucose: Secondary | ICD-10-CM | POA: Diagnosis not present

## 2014-02-08 DIAGNOSIS — Z9181 History of falling: Secondary | ICD-10-CM

## 2014-02-08 DIAGNOSIS — Z125 Encounter for screening for malignant neoplasm of prostate: Secondary | ICD-10-CM

## 2014-02-08 DIAGNOSIS — R7303 Prediabetes: Secondary | ICD-10-CM

## 2014-02-08 DIAGNOSIS — Z0001 Encounter for general adult medical examination with abnormal findings: Secondary | ICD-10-CM | POA: Diagnosis not present

## 2014-02-09 LAB — CBC WITH DIFFERENTIAL/PLATELET
Basophils Absolute: 0.1 10*3/uL (ref 0.0–0.1)
Basophils Relative: 1 % (ref 0–1)
Eosinophils Absolute: 0.2 10*3/uL (ref 0.0–0.7)
Eosinophils Relative: 4 % (ref 0–5)
HCT: 44.8 % (ref 39.0–52.0)
HEMOGLOBIN: 14.6 g/dL (ref 13.0–17.0)
LYMPHS PCT: 28 % (ref 12–46)
Lymphs Abs: 1.5 10*3/uL (ref 0.7–4.0)
MCH: 29.6 pg (ref 26.0–34.0)
MCHC: 32.6 g/dL (ref 30.0–36.0)
MCV: 90.9 fL (ref 78.0–100.0)
MPV: 9.8 fL (ref 9.4–12.4)
Monocytes Absolute: 0.4 10*3/uL (ref 0.1–1.0)
Monocytes Relative: 8 % (ref 3–12)
Neutro Abs: 3.1 10*3/uL (ref 1.7–7.7)
Neutrophils Relative %: 59 % (ref 43–77)
Platelets: 272 10*3/uL (ref 150–400)
RBC: 4.93 MIL/uL (ref 4.22–5.81)
RDW: 15 % (ref 11.5–15.5)
WBC: 5.2 10*3/uL (ref 4.0–10.5)

## 2014-02-09 LAB — HEPATIC FUNCTION PANEL
ALT: 21 U/L (ref 0–53)
AST: 15 U/L (ref 0–37)
Albumin: 4.1 g/dL (ref 3.5–5.2)
Alkaline Phosphatase: 47 U/L (ref 39–117)
BILIRUBIN DIRECT: 0.1 mg/dL (ref 0.0–0.3)
BILIRUBIN INDIRECT: 0.4 mg/dL (ref 0.2–1.2)
Total Bilirubin: 0.5 mg/dL (ref 0.2–1.2)
Total Protein: 6.4 g/dL (ref 6.0–8.3)

## 2014-02-09 LAB — TSH: TSH: 1.043 u[IU]/mL (ref 0.350–4.500)

## 2014-02-09 LAB — VITAMIN D 25 HYDROXY (VIT D DEFICIENCY, FRACTURES): Vit D, 25-Hydroxy: 64 ng/mL (ref 30–100)

## 2014-02-09 LAB — LIPID PANEL
CHOL/HDL RATIO: 2.9 ratio
Cholesterol: 135 mg/dL (ref 0–200)
HDL: 46 mg/dL (ref >39)
LDL Cholesterol: 63 mg/dL (ref 0–99)
Triglycerides: 128 mg/dL (ref <150)
VLDL: 26 mg/dL (ref 0–40)

## 2014-02-09 LAB — BASIC METABOLIC PANEL WITH GFR
BUN: 22 mg/dL (ref 6–23)
CHLORIDE: 102 meq/L (ref 96–112)
CO2: 32 mEq/L (ref 19–32)
Calcium: 9.2 mg/dL (ref 8.4–10.5)
Creat: 1.07 mg/dL (ref 0.50–1.35)
GFR, Est African American: 81 mL/min
GFR, Est Non African American: 70 mL/min
GLUCOSE: 87 mg/dL (ref 70–99)
POTASSIUM: 4.9 meq/L (ref 3.5–5.3)
Sodium: 141 mEq/L (ref 135–145)

## 2014-02-09 LAB — MICROALBUMIN / CREATININE URINE RATIO
Creatinine, Urine: 249.1 mg/dL
MICROALB/CREAT RATIO: 7.2 mg/g (ref 0.0–30.0)
Microalb, Ur: 1.8 mg/dL (ref <2.0)

## 2014-02-09 LAB — MAGNESIUM: Magnesium: 1.8 mg/dL (ref 1.5–2.5)

## 2014-02-09 LAB — HEMOGLOBIN A1C
HEMOGLOBIN A1C: 6.4 % — AB (ref <5.7)
Mean Plasma Glucose: 137 mg/dL — ABNORMAL HIGH (ref <117)

## 2014-02-09 LAB — URINALYSIS, MICROSCOPIC ONLY
BACTERIA UA: NONE SEEN
CASTS: NONE SEEN
CRYSTALS: NONE SEEN
SQUAMOUS EPITHELIAL / LPF: NONE SEEN

## 2014-02-10 LAB — PSA: PSA: 0.5 ng/mL (ref <=4.00)

## 2014-02-10 LAB — INSULIN, FASTING: Insulin fasting, serum: 34.2 u[IU]/mL — ABNORMAL HIGH (ref 2.0–19.6)

## 2014-02-11 LAB — LEVETIRACETAM LEVEL: KEPPRA (LEVETIRACETAM): 9.7 ug/mL

## 2014-04-07 ENCOUNTER — Other Ambulatory Visit: Payer: Self-pay | Admitting: Physician Assistant

## 2014-05-11 ENCOUNTER — Ambulatory Visit: Payer: Self-pay | Admitting: Physician Assistant

## 2014-08-18 ENCOUNTER — Encounter: Payer: Self-pay | Admitting: Internal Medicine

## 2014-08-18 ENCOUNTER — Ambulatory Visit (INDEPENDENT_AMBULATORY_CARE_PROVIDER_SITE_OTHER): Payer: Medicare Other | Admitting: Physician Assistant

## 2014-08-18 VITALS — BP 116/74 | HR 64 | Temp 97.7°F | Resp 16 | Ht 69.5 in | Wt 266.2 lb

## 2014-08-18 DIAGNOSIS — Z0001 Encounter for general adult medical examination with abnormal findings: Secondary | ICD-10-CM | POA: Diagnosis not present

## 2014-08-18 DIAGNOSIS — G40309 Generalized idiopathic epilepsy and epileptic syndromes, not intractable, without status epilepticus: Secondary | ICD-10-CM | POA: Diagnosis not present

## 2014-08-18 DIAGNOSIS — F32A Depression, unspecified: Secondary | ICD-10-CM

## 2014-08-18 DIAGNOSIS — I482 Chronic atrial fibrillation, unspecified: Secondary | ICD-10-CM | POA: Insufficient documentation

## 2014-08-18 DIAGNOSIS — Z9989 Dependence on other enabling machines and devices: Secondary | ICD-10-CM

## 2014-08-18 DIAGNOSIS — R6889 Other general symptoms and signs: Secondary | ICD-10-CM | POA: Diagnosis not present

## 2014-08-18 DIAGNOSIS — I10 Essential (primary) hypertension: Secondary | ICD-10-CM

## 2014-08-18 DIAGNOSIS — E782 Mixed hyperlipidemia: Secondary | ICD-10-CM

## 2014-08-18 DIAGNOSIS — E559 Vitamin D deficiency, unspecified: Secondary | ICD-10-CM

## 2014-08-18 DIAGNOSIS — Z789 Other specified health status: Secondary | ICD-10-CM | POA: Diagnosis not present

## 2014-08-18 DIAGNOSIS — F324 Major depressive disorder, single episode, in partial remission: Secondary | ICD-10-CM | POA: Insufficient documentation

## 2014-08-18 DIAGNOSIS — Z0000 Encounter for general adult medical examination without abnormal findings: Secondary | ICD-10-CM

## 2014-08-18 DIAGNOSIS — R7309 Other abnormal glucose: Secondary | ICD-10-CM | POA: Diagnosis not present

## 2014-08-18 DIAGNOSIS — F329 Major depressive disorder, single episode, unspecified: Secondary | ICD-10-CM

## 2014-08-18 DIAGNOSIS — Z1331 Encounter for screening for depression: Secondary | ICD-10-CM

## 2014-08-18 DIAGNOSIS — R569 Unspecified convulsions: Secondary | ICD-10-CM

## 2014-08-18 DIAGNOSIS — Z79899 Other long term (current) drug therapy: Secondary | ICD-10-CM

## 2014-08-18 DIAGNOSIS — R7303 Prediabetes: Secondary | ICD-10-CM

## 2014-08-18 DIAGNOSIS — N400 Enlarged prostate without lower urinary tract symptoms: Secondary | ICD-10-CM

## 2014-08-18 DIAGNOSIS — G4733 Obstructive sleep apnea (adult) (pediatric): Secondary | ICD-10-CM

## 2014-08-18 DIAGNOSIS — Z9181 History of falling: Secondary | ICD-10-CM

## 2014-08-18 DIAGNOSIS — I2510 Atherosclerotic heart disease of native coronary artery without angina pectoris: Secondary | ICD-10-CM

## 2014-08-18 DIAGNOSIS — N138 Other obstructive and reflux uropathy: Secondary | ICD-10-CM | POA: Insufficient documentation

## 2014-08-18 DIAGNOSIS — N401 Enlarged prostate with lower urinary tract symptoms: Secondary | ICD-10-CM | POA: Insufficient documentation

## 2014-08-18 MED ORDER — TAMSULOSIN HCL 0.4 MG PO CAPS: 0.4000 mg | ORAL_CAPSULE | Freq: Every day | ORAL | Status: DC

## 2014-08-18 NOTE — Patient Instructions (Addendum)
Recommend Adult Low dose Aspirin or baby Aspirin 81 mg daily   To reduce risk of Colon Cancer 20 %,   Skin Cancer 26 % ,   Melanoma 46%   and   Pancreatic cancer 60%  ++++++++++++++++++   Recommend the book "The END of DIETING" by Dr Excell Seltzer   & the book "The END of DIABETES " by Dr Excell Seltzer  At [email or web address] - get book & Audio CD's     Being diabetic has a  300% increased risk for heart attack, stroke, cancer, and alzheimer- type vascular dementia. It is very important that you work harder with diet by avoiding all foods that are white. Avoid white rice (brown & wild rice is OK), white potatoes (sweetpotatoes in moderation is OK), White bread or wheat bread or anything made out of white flour like bagels, donuts, rolls, buns, biscuits, cakes, pastries, cookies, pizza crust, and pasta (made from white flour & egg whites) - vegetarian pasta or spinach or wheat pasta is OK. Multigrain breads like Arnold's or Pepperidge Farm, or multigrain sandwich thins or flatbreads.  Diet, exercise and weight loss can reverse and cure diabetes in the early stages.  Diet, exercise and weight loss is very important in the control and prevention of complications of diabetes which affects every system in your body, ie. Brain - dementia/stroke, eyes - glaucoma/blindness, heart - heart attack/heart failure, kidneys - dialysis, stomach - gastric paralysis, intestines - malabsorption, nerves - severe painful neuritis, circulation - gangrene & loss of a leg(s), and finally cancer and Alzheimers.    I recommend avoid fried & greasy foods,  sweets/candy, white rice (brown or wild rice or Quinoa is OK), white potatoes (sweet potatoes are OK) - anything made from white flour - bagels, doughnuts, rolls, buns, biscuits,white and wheat breads, pizza crust and traditional pasta made of white flour & egg white(vegetarian pasta or spinach or wheat pasta is OK).  Multi-grain bread is OK - like multi-grain flat bread or  sandwich thins. Avoid alcohol in excess. Exercise is also important.    Eat all the vegetables you want - avoid meat, especially red meat and dairy - especially cheese.  Cheese is the most concentrated form of trans-fats which is the worst thing to clog up our arteries. Veggie cheese is OK which can be found in the fresh produce section at [employer] or [employer] or [employer]  ++++++++++++++++++++++++++  Vitamin D goal is between 70-100.   Please make sure that you are taking your Vitamin D as directed.   It is very important as a natural anti-inflammatory   helping hair, skin, and nails, as well as reducing stroke and heart attack risk.   It helps your bones and helps with mood.  It also decreases numerous cancer risks so please take it as directed.   Low Vit D is associated with a 200-300% higher risk for CANCER   and 200-300% higher risk for HEART   ATTACK  &  STROKE.    .....................................Marland Kitchen  It is also associated with higher death rate at younger ages,   autoimmune diseases like Rheumatoid arthritis, Lupus, Multiple Sclerosis.     Also many other serious conditions, like depression, Alzheimer's  Dementia, infertility, muscle aches, fatigue, fibromyalgia - just to name a few.  +++++++++++++++++++   Can call Dr. Toy Cookey to get evaluated for dental sleep appliance. # [phone]  OR you can try Dr. Clearence Ped in [facility] # [phone]  We  will send notes. Call and get price quote on both.      Bad carbs also include fruit juice, alcohol, and sweet tea. These are empty calories that do not signal to your brain that you are full.   Please remember the good carbs are still carbs which convert into sugar. So please measure them out no more than 1/2-1 cup of rice, oatmeal, pasta, and beans  Veggies are however free foods! Pile them on.   Not all fruit is created equal. Please see the list below, the fruit at the bottom is higher in sugars  than the fruit at the top. Please avoid all dried fruits.     Before you even begin to attack a weight-loss plan, it pays to remember this: You are not fat. You have fat. Losing weight isn't about blame or shame; it's simply another achievement to accomplish. Dieting is like any other skill-you have to buckle down and work at it. As long as you act in a smart, reasonable way, you'll ultimately get where you want to be. Here are some weight loss pearls for you.  1. It's Not a Diet. It's a Lifestyle Thinking of a diet as something you're on and suffering through only for the short term doesn't work. To shed weight and keep it off, you need to make permanent changes to the way you eat. It's OK to indulge occasionally, of course, but if you cut calories temporarily and then revert to your old way of eating, you'll gain back the weight quicker than you can say yo-yo. Use it to lose it. Research shows that one of the best predictors of long-term weight loss is how many pounds you drop in the first month. For that reason, nutritionists often suggest being stricter for the first two weeks of your new eating strategy to build momentum. Cut out added sugar and alcohol and avoid unrefined carbs. After that, figure out how you can reincorporate them in a way that's healthy and maintainable.  2. There's a Right Way to Exercise Working out burns calories and fat and boosts your metabolism by building muscle. But those trying to lose weight are notorious for overestimating the number of calories they burn and underestimating the amount they take in. Unfortunately, your system is biologically programmed to hold on to extra pounds and that means when you start exercising, your body senses the deficit and ramps up its hunger signals. If you're not diligent, you'll eat everything you burn and then some. Use it to lose it. Cardio gets all the exercise glory, but strength and interval training are the real heroes. They help you  build lean muscle, which in turn increases your metabolism and calorie-burning ability 3. Don't Overreact to Mild Hunger Some people have a hard time losing weight because of hunger anxiety. To them, being hungry is bad-something to be avoided at all costs-so they carry snacks with them and eat when they don't need to. Others eat because they're stressed out or bored. While you never want to get to the point of being ravenous (that's when bingeing is likely to happen), a hunger pang, a craving, or the fact that it's 3:00 p.m. should not send you racing for the vending machine or obsessing about the energy bar in your purse. Ideally, you should put off eating until your stomach is growling and it's difficult to concentrate.  Use it to lose it. When you feel the urge to eat, use the HALT method. Ask yourself, Am  I really hungry? Or am I angry or anxious, lonely or bored, or tired? If you're still not certain, try the apple test. If you're truly hungry, an apple should seem delicious; if it doesn't, something else is going on. Or you can try drinking water and making yourself busy, if you are still hungry try a healthy snack.  4. Not All Calories Are Created Equal The mechanics of weight loss are pretty simple: Take in fewer calories than you use for energy. But the kind of food you eat makes all the difference. Processed food that's high in saturated fat and refined starch or sugar can cause inflammation that disrupts the hormone signals that tell your brain you're full. The result: You eat a lot more.  Use it to lose it. Clean up your diet. Swap in whole, unprocessed foods, including vegetables, lean protein, and healthy fats that will fill you up and give you the biggest nutritional bang for your calorie buck. In a few weeks, as your brain starts receiving regular hunger and fullness signals once again, you'll notice that you feel less hungry overall and naturally start cutting back on the amount you eat.  5.  Protein, Produce, and Plant-Based Fats Are Your Weight-Loss Trinity Here's why eating the three Ps regularly will help you drop pounds. Protein fills you up. You need it to build lean muscle, which keeps your metabolism humming so that you can torch more fat. People in a weight-loss program who ate double the recommended daily allowance for protein (about 110 grams for a 150-pound woman) lost 70 percent of their weight from fat, while people who ate the RDA lost only about 40 percent, one study found. Produce is packed with filling fiber. "It's very difficult to consume too many calories if you're eating a lot of vegetables. Example: Three cups of broccoli is a lot of food, yet only 93 calories. (Fruit is another story. It can be easy to overeat and can contain a lot of calories from sugar, so be sure to monitor your intake.) Plant-based fats like olive oil and those in avocados and nuts are healthy and extra satiating.  Use it to lose it. Aim to incorporate each of the three Ps into every meal and snack. People who eat protein throughout the day are able to keep weight off, according to a study in the [place] of Clinical Nutrition. In addition to meat, poultry and seafood, good sources are beans, lentils, eggs, tofu, and yogurt. As for fat, keep portion sizes in check by measuring out salad dressing, oil, and nut butters (shoot for one to two tablespoons). Finally, eat veggies or a little fruit at every meal. People who did that consumed 308 fewer calories but didn't feel any hungrier than when they didn't eat more produce.  7. How You Eat Is As Important As What You Eat In order for your brain to register that you're full, you need to focus on what you're eating. Sit down whenever you eat, preferably at a table. Turn off the TV or computer, put down your phone, and look at your food. Smell it. Chew slowly, and don't put another bite on your fork until you swallow. When women ate lunch this  attentively, they consumed 30 percent less when snacking later than those who listened to an audiobook at lunchtime, according to a study in the [place] of Nutrition. 8. Weighing Yourself Really Works The scale provides the best evidence about whether your efforts are paying off.  Seeing the numbers tick up or down or stagnate is motivation to keep going-or to rethink your approach. A 2015 study at [facility] found that daily weigh-ins helped people lose more weight, keep it off, and maintain that loss, even after two years. Use it to lose it. Step on the scale at the same time every day for the best results. If your weight shoots up several pounds from one weigh-in to the next, don't freak out. Eating a lot of salt the night before or having your period is the likely culprit. The number should return to normal in a day or two. It's a steady climb that you need to do something about. 9. Too Much Stress and Too Little Sleep Are Your Enemies When you're tired and frazzled, your body cranks up the production of cortisol, the stress hormone that can cause carb cravings. Not getting enough sleep also boosts your levels of ghrelin, a hormone associated with hunger, while suppressing leptin, a hormone that signals fullness and satiety. People on a diet who slept only five and a half hours a night for two weeks lost 55 percent less fat and were hungrier than those who slept eight and a half hours, according to a study in the [place]. Use it to lose it. Prioritize sleep, aiming for seven hours or more a night, which research shows helps lower stress. And make sure you're getting quality zzz's. If a snoring spouse or a fidgety cat wakes you up frequently throughout the night, you may end up getting the equivalent of just four hours of sleep, according to a study from [facility]. Keep pets out of the bedroom, and use a white-noise app to drown out snoring. 10. You  Will Hit a plateau-And You Can Bust Through It As you slim down, your body releases much less leptin, the fullness hormone.  If you're not strength training, start right now. Building muscle can raise your metabolism to help you overcome a plateau. To keep your body challenged and burning calories, incorporate new moves and more intense intervals into your workouts or add another sweat session to your weekly routine. Alternatively, cut an extra 100 calories or so a day from your diet. Now that you've lost weight, your body simply doesn't need as much fuel.   Ways to cut 100 calories  1. Eat your eggs with hot sauce OR salsa instead of cheese.  Eggs are great for breakfast, but many people consider eggs and cheese to be BFFs. Instead of cheese-1 oz. of cheddar has 114 calories-top your eggs with hot sauce, which contains no calories and helps with satiety and metabolism. Salsa is also a great option!!  2. Top your toast, waffles or pancakes with mashed berries instead of jelly or syrup. Half a cup of berries-fresh, frozen or thawed-has about 40 calories, compared with 2 tbsp. of maple syrup or jelly, which both have about 100 calories. The berries will also give you a good punch of fiber, which helps keep you full and satisfied and won't spike blood sugar quickly like the jelly or syrup. 3. Swap the non-fat latte for black coffee with a splash of half-and-half. Contrary to its name, that non-fat latte has 130 calories and a startling 19g of carbohydrates per 16 oz. serving. Replacing that 'light' drinkable dessert with a black coffee with a splash of half-and-half saves you more than 100 calories per 16 oz. serving. 4. Sprinkle salads with freeze-dried raspberries instead of dried cranberries.  If you want a sweet addition to your nutritious salad, stay away from dried cranberries. They have a whopping 130 calories per  cup and 30g carbohydrates. Instead, sprinkle freeze-dried raspberries guilt-free and  save more than 100 calories per  cup serving, adding 3g of belly-filling fiber. 5. Go for mustard in place of mayo on your sandwich. Mustard can add really nice flavor to any sandwich, and there are tons of varieties, from spicy to honey. A serving of mayo is 95 calories, versus 10 calories in a serving of mustard. 6. Choose a DIY salad dressing instead of the store-bought kind. Mix Dijon or whole grain mustard with low-fat Kefir or red wine vinegar and garlic. 7. Use hummus as a spread instead of a dip. Use hummus as a spread on a high-fiber cracker or tortilla with a sandwich and save on calories without sacrificing taste. 8. Pick just one salad "accessory." Salad isn't automatically a calorie winner. It's easy to over-accessorize with toppings. Instead of topping your salad with nuts, avocado and cranberries (all three will clock in at 313 calories), just pick one. The next day, choose a different accessory, which will also keep your salad interesting. You don't wear all your jewelry every day, right? 9. Ditch the white pasta in favor of spaghetti squash. One cup of cooked spaghetti squash has about 40 calories, compared with traditional spaghetti, which comes with more than 200. Spaghetti squash is also nutrient-dense. It's a good source of fiber and Vitamins A and C, and it can be eaten just like you would eat pasta-with a great tomato sauce and [place] meatballs or with pesto, tofu and spinach, for example. 10. Dress up your chili, soups and stews with non-fat [place] yogurt instead of sour cream. Just a 'dollop' of sour cream can set you back 115 calories and a whopping 12g of fat-seven of which are of the artery-clogging variety. Added bonus: [place] yogurt is packed with muscle-building protein, calcium and B Vitamins. 11. Mash cauliflower instead of mashed potatoes. One cup of traditional mashed potatoes-in all their creamy goodness-has more than 200 calories, compared to mashed cauliflower,  which you can typically eat for less than 100 calories per 1 cup serving. Cauliflower is a great source of the antioxidant indole-3-carbinol (I3C), which may help reduce the risk of some cancers, like breast cancer. 12. Ditch the ice cream sundae in favor of a [place] yogurt parfait. Instead of a cup of ice cream or fro-yo for dessert, try 1 cup of nonfat Greek yogurt topped with fresh berries and a sprinkle of cacao nibs. Both toppings are packed with antioxidants, which can help reduce cellular inflammation and oxidative damage. And the comparison is a no-brainer: One cup of ice cream has about 275 calories; one cup of frozen yogurt has about 230; and a cup of Greek yogurt has just 130, plus twice the protein, so you're less likely to return to the freezer for a second helping. 13. Put olive oil in a spray container instead of using it directly from the bottle. Each tablespoon of olive oil is 120 calories and 15g of fat. Use a mister instead of pouring it straight into the pan or onto a salad. This allows for portion control and will save you more than 100 calories. 14. When baking, substitute canned pumpkin for butter or oil. Canned pumpkin-not pumpkin pie mix-is loaded with Vitamin A, which is important for skin and eye health, as well as immunity. And the comparisons are pretty crazy:  cup  of canned pumpkin has about 40 calories, compared to butter or oil, which has more than 800 calories. Yes, 800 calories. Applesauce and mashed banana can also serve as good substitutions for butter or oil, usually in a 1:1 ratio. 15. Top casseroles with high-fiber cereal instead of breadcrumbs. Breadcrumbs are typically made with white bread, while breakfast cereals contain 5-9g of fiber per serving. Not only will you save more than 150 calories per  cup serving, the swap will also keep you more full and you'll get a metabolism boost from the added fiber. 16. Snack on pistachios instead of macadamia nuts. Believe it  or not, you get the same amount of calories from 35 pistachios (100 calories) as you would from only five macadamia nuts. 17. Chow down on kale chips rather than potato chips. This is my favorite 'don't knock it 'till you try it' swap. Kale chips are so easy to make at home, and you can spice them up with a little grated parmesan or chili powder. Plus, they're a mere fraction of the calories of potato chips, but with the same crunch factor we crave so often. 18. Add seltzer and some fruit slices to your cocktail instead of soda or fruit juice. One cup of soda or fruit juice can pack on as much as 140 calories. Instead, use seltzer and fruit slices. The fruit provides valuable phytochemicals, such as flavonoids and anthocyanins, which help to combat cancer and stave off the aging process.

## 2014-08-18 NOTE — Progress Notes (Signed)
Patient ID: George Delgado, male   DOB: 12/30/1945, 69 y.o.   MRN: [id number]  MEDICARE ANNUAL WELLNESS VISIT AND OV  Assessment:   1. Essential hypertension - continue medications, DASH diet, exercise and monitor at home. Call if greater than 130/80.  - TSH  2. Hyperlipidemia -continue medications, check lipids, decrease fatty foods, increase activity.  - Lipid panel  3. Prediabetes Discussed general issues about diabetes pathophysiology and management., Educational material distributed., Suggested low cholesterol diet., Encouraged aerobic exercise., Discussed foot care., Reminded to get yearly retinal exam. - Hemoglobin A1c - Insulin, random  4. Vitamin D deficiency - Vit D  25 hydroxy (rtn osteoporosis monitoring)  5. Generalized convulsive epilepsy Check keppra level, no seizure for over a year - Levetiracetam level  6. OSA on CPAP Would benefit due to afib/fatigue/HTN- unable to tolerate mask, [facility], if he can not tolerate will refer to Dr. Toy Cookey.   7. ASCVD (arteriosclerotic cardiovascular disease) Control blood pressure, cholesterol, glucose, increase exercise.   8. Morbid obesity (BMI 37.97) Obesity with co morbidities- long discussion about weight loss, diet, and exercise  9. Depression screen negative  10. Depression, controlled Continue zoloft.   11. Medication management - Levetiracetam level - CBC with Differential/Platelet - BASIC METABOLIC PANEL WITH GFR - Hepatic function panel - Magnesium  12. At low risk for fall  13. Routine general medical examination at a health care facility  14. . Chronic a-fib Rate controlled, continue xarelto and BB No palpitations, CP.   16. BPH (benign prostatic hyperplasia) Doxazosin has caused dizziness in the past and he is at max dose, will switch to tamsulosin 0,4mg  QHS to decrease symptoms of dizziness and BPH.    Plan:   During the course of the visit the patient was educated  and counseled about appropriate screening and preventive services including:    Pneumococcal vaccine   Influenza vaccine  Td vaccine  Screening electrocardiogram  Bone densitometry screening  Colorectal cancer screening  Diabetes screening  Glaucoma screening  Nutrition counseling   Advanced directives: requested  Conditions/risks identified: BMI: Discussed weight loss, diet, and increase physical activity.  Increase physical activity: AHA recommends 150 minutes of physical activity a week.  Medications reviewed Diabetes is at goal, ACE/ARB therapy: Yes. Urinary Incontinence is not an issue: discussed non pharmacology and pharmacology options.  Fall risk: low- discussed PT, home fall assessment, medications.   Subjective:    George Delgado  presents for Medicare Annual Wellness Visit and complete physical.  Date of last medicare wellness visit was 05/01/2013. This very nice 69 y.o. MWM presents for 3 month follow up with Hypertension, Hyperlipidemia, Pre-Diabetes and Vitamin D Deficiency.    Patient is treated for HTN & BP has been controlled at home. Today's BP: 116/74 mmHg. He has a history of CVA s/p left CEA. He is on Keppra for seizures due to the CVA, he continues to have some memory and speech issues. He was found to have Afib with RVR last year, his rate is controlled and he is on Xarelto, follows with cardiology at the [facility] hospital. Normal Stress test 07/2011.  Patient has had no complaints of any cardiac type chest pain, palpitations, dyspnea/orthopnea/PND, dizziness, claudication, or dependent edema.   Hyperlipidemia is controlled with diet & meds. Patient denies myalgias or other med SE's. Last Lipids were 02/08/2014: Cholesterol 135; HDL 46; LDL Cholesterol 63; Triglycerides 128   Also, the patient has history of PreDiabetes and has had no symptoms of  reactive hypoglycemia, diabetic polys, paresthesias or visual blurring.  Last A1c was 02/08/2014: Hgb A1c MFr Bld  6.4% on     Further, the patient also has history of Vitamin D Deficiency and supplements vitamin D without any suspected side-effects. Last vitamin D was  64 on Nov 30,2015.  He is on zoloft for depression and states that it is controlled.   He has BPH symptoms, states the doxazosin 8 mg is not helping as much as it did in the past.   BMI is Body mass index is 38.76 kg/(m^2)., he is working on diet and exercise. He has OSA and has a CPAP but states that he is unable to tolerate it. Working the [place] now to try to get better mask/device.  Wt Readings from Last 3 Encounters:  08/18/14 266 lb 3.2 oz (120.748 kg)  02/08/14 268 lb 3.2 oz (121.655 kg)  10/12/13 260 lb 12.8 oz (118.298 kg)   He has chronic pain, has kyphosis with his back and has been having left knee pain. Bothering him in the AM, during the day, and at night. Denies popping, clicking, but occ gives out on him.   Medication Review: Medication Sig  . atorvastatin  80 MG tablet Take 1 tablet (80 mg total) by mouth daily.  . SYMBICORT 160-4.5  inhaler Inhale 2 puffs into the lungs 2 (two) times daily.  Marland Kitchen VITAMIN D 1000 UNITS tablet Take 1,000 Units by mouth daily. Take 8,000 IU daily.  Marland Kitchen doxazosin  8 MG tablet TAKE ONE TABLET BY MOUTH ONCE DAILY  . levETIRAcetam (KEPPRA) 500 MG tablet TAKE ONE TABLET BY MOUTH TWICE DAILY FOR  SEIZURES  . metoprolol succinate -XL 25 MG 24 hr tablet Take 25 mg by mouth 2 (two) times daily.  . Multiple Vitamin  Take 1 tablet by mouth daily.  . rivaroxaban (XARELTO) 20 MG TABS tablet Take 20 mg by mouth. Takes 2 tabs daily in the evening  . sertraline  100 MG tablet Take 2 tablets (200 mg total) by mouth daily.   Current Problems (verified) Patient Active Problem List   Diagnosis Date Noted  . Depression, controlled 08/18/2014  . Seizures 08/18/2014  . Chronic a-fib 08/18/2014  . Medication management 07/07/2013  . Vitamin D deficiency 04/30/2013  . Morbid obesity (BMI 37.97) 04/30/2013  .  Generalized convulsive epilepsy 01/29/2013  . OSA on CPAP 01/29/2013  . Essential hypertension 01/28/2013  . ASCVD (arteriosclerotic cardiovascular disease) 01/28/2013  . Hyperlipidemia 01/28/2013  . Prediabetes 01/28/2013   Screening Tests Health Maintenance  Topic Date Due  . ZOSTAVAX  07/08/2003  . INFLUENZA VACCINE  10/11/2014  . TETANUS/TDAP  03/13/2016  . COLONOSCOPY  06/03/2016  . PNA vac Low Risk Adult  Completed   Immunization History  Administered Date(s) Administered  . Influenza Whole 01/19/2011, 01/22/2012  . Influenza-Unspecified 12/28/2012, 12/10/2013  . Pneumococcal Conjugate-13 10/10/2013  . Pneumococcal-Unspecified 03/12/2009  . Td 03/12/2006   Preventative care: Last colonoscopy: 12/2013 at the [facility] due 3 years  Vaccinations: Immunization History  Administered Date(s) Administered  . Influenza Whole 01/19/2011, 01/22/2012  . Influenza-Unspecified 12/28/2012, 12/10/2013  . Pneumococcal Conjugate-13 10/10/2013  . Pneumococcal-Unspecified 03/12/2009  . Td 03/12/2006   TD or Tdap: 2008  Influenza: 12/2013 Prevnar 13: 2015 Pneumococcal: 2011 Shingles/Zostavax: wants, will check price   Names of Other Physician/Practitioners you currently use: 1. [facility] here for primary care Patient had recent cataract removal at the [facility] hospital, follows with eye doctor there, got  new glasses, last visit 04/2014 Follows with cardiologist, PCP, and GI at the [facility] hospital.  Patient Care Team: Unk Pinto, MD as PCP - General (Internal Medicine)  Allergies No Known Allergies Surgical history Past Surgical History  Procedure Laterality Date  . Ankle surgery    . Carotid endarterectomy      06/2010   Family history Family History  Problem Relation Age of Onset  . Ovarian cancer Mother   . Hypertension Mother   . Hypertension Father   . Cancer Father   . Hypertension Sister   . Hypertension Brother   . Diabetes  Brother   . Diabetes Daughter   . Diabetes Son     Risk Factors: Tobacco History  Substance Use Topics  . Smoking status: Former Smoker    Quit date: 05/11/2010  . Smokeless tobacco: Never Used  . Alcohol Use: No     Comment: rarely   He does not smoke.  Patient is a former smoker. Are there smokers in your home (other than you)?  No  Alcohol Current alcohol use: none  Caffeine Current caffeine use: coffee 4-5 /day  Exercise Current exercise: walking  Nutrition/Diet Current diet: in general, a "healthy" diet    Cardiac risk factors: advanced age (older than 43 for men, 62 for women), dyslipidemia, family history of premature cardiovascular disease, hypertension, male gender and obesity (BMI >= 30 kg/m2).  Depression Screen (Note: if answer to either of the following is "Yes", a more complete depression screening is indicated)   Q1: Over the past two weeks, have you felt down, depressed or hopeless? No  Q2: Over the past two weeks, have you felt little interest or pleasure in doing things? No  Have you lost interest or pleasure in daily life? No  Do you often feel hopeless? No  Do you cry easily over simple problems? No  Activities of Daily Living In your present state of health, do you have any difficulty performing the following activities?:  Driving? No Managing money?  No Feeding yourself? No Getting from bed to chair? No Climbing a flight of stairs? No Preparing food and eating?: No Bathing or showering? No Getting dressed: No Getting to the toilet? No Using the toilet:No Moving around from place to place: No In the past year have you fallen or had a near fall?:No   Are you sexually active?  No  Do you have more than one partner?  No  Vision Difficulties: No, wears glasses  Hearing Difficulties: Yes, getting hearing test at the [place] Do you often ask people to speak up or repeat themselves? Yes Do you experience ringing or noises in your ears? No Do  you have difficulty understanding soft or whispered voices? Yes  Cognition  Do you feel that you have a problem with memory?Yes  Do you often misplace items? No  Do you feel safe at home?  Yes  Advanced directives Does patient have a Stephen? Yes Does patient have a Living Will? Yes  Review of Systems  Constitutional: Positive for malaise/fatigue (with exertion). Negative for fever, chills, weight loss and diaphoresis.  Eyes: Negative.  Negative for blurred vision and double vision.  Respiratory: Positive for shortness of breath (with exertion occ) and wheezing (better). Negative for cough, hemoptysis and sputum production.   Cardiovascular: Positive for leg swelling. Negative for chest pain, palpitations, orthopnea, claudication and PND.  Gastrointestinal: Negative.  Negative for heartburn, nausea, abdominal pain, diarrhea, constipation, blood in stool and  melena.  Genitourinary: Positive for urgency and frequency. Negative for dysuria, hematuria and flank pain.  Musculoskeletal: Positive for myalgias, back pain, joint pain (left knee pain worse) and neck pain. Negative for falls.  Skin: Negative.   Neurological: Negative.  Negative for dizziness and weakness.  Psychiatric/Behavioral: Positive for memory loss. Negative for depression, suicidal ideas, hallucinations and substance abuse. The patient has insomnia. The patient is not nervous/anxious.      Objective:     BP 116/74 mmHg  Pulse 64  Temp(Src) 97.7 F (36.5 C)  Resp 16  Ht 5' 9.5" (1.765 m)  Wt 266 lb 3.2 oz (120.748 kg)  BMI 38.76 kg/m2  General Appearance:  Alert  WD/WN, male  in no apparent distress. Eyes: PERRLA, EOMs nl, conjunctiva normal, normal fundi and vessels. Sinuses: No frontal/maxillary tenderness ENT/Mouth: EACs patent / TMs  nl. Nares clear without erythema, swelling, mucoid exudates. Oral hygiene is good. No erythema, swelling, or exudate. Tongue normal, non-obstructing. Tonsils  not swollen or erythematous. Hearing decreased.  Neck: Supple, thyroid normal. No bruits, nodes or JVD. Well healed scar on over left carotid.  Respiratory: Respiratory effort normal.  BS equal and clear bilateral without rales, rhonci, wheezing or stridor. Cardio: Heart sounds are normal with normal rate and  irregular rhythm and no murmurs, rubs or gallops. Peripheral pulses are normal and equal bilaterally with 1+edema. No aortic or femoral bruits. Chest: symmetric with normal excursions and percussion.  Abdomen: Flat, soft, with nl bowel sounds. Nontender, no guarding, rebound, hernias, masses, or organomegaly.  Lymphatics: Non tender without lymphadenopathy.  Genitourinary: No hernias. Musculoskeletal: Full ROM all peripheral extremities, joint stability, 5/5 strength, and antalgic gait. Left knee without erythema, swelling, effusion, + medial joint line tenderness without laxity.  Skin: Warm and dry without rashes, lesions, cyanosis, clubbing or  ecchymosis.  Neuro: Cranial nerves intact, reflexes equal bilaterally. Normal muscle tone, no cerebellar symptoms. Sensation intact.  Pysch: Alert and oriented X 3 with normal affect, insight and judgment appropriate.   Cognitive Testing  Alert? Yes  Normal Appearance? Yes  Oriented to person? Yes  Place? Yes   Time? Yes  Recall of three objects?  Yes  Can perform simple calculations? Yes  Displays appropriate judgment? Yes  Can read the correct time from a watch/clock? Yes  Medicare Attestation I have personally reviewed: The patient's medical and social history Their use of alcohol, tobacco or illicit drugs Their current medications and supplements The patient's functional ability including ADLs,fall risks, home safety risks, cognitive, and hearing and visual impairment Diet and physical activities Evidence for depression or mood disorders  The patient's weight, height, BMI, and visual acuity have been recorded in the chart.  I have  made referrals, counseling, and provided education to the patient based on review of the above and I have provided the patient with a written personalized care plan for preventive services.  Over 40 minutes of exam, counseling, chart review was performed.  Vicie Mutters, PA-C   08/18/2014

## 2014-08-19 LAB — CBC WITH DIFFERENTIAL/PLATELET
BASOS ABS: 0.1 10*3/uL (ref 0.0–0.1)
Basophils Relative: 1 % (ref 0–1)
Eosinophils Absolute: 0.2 10*3/uL (ref 0.0–0.7)
Eosinophils Relative: 4 % (ref 0–5)
HCT: 48.2 % (ref 39.0–52.0)
Hemoglobin: 15.9 g/dL (ref 13.0–17.0)
Lymphocytes Relative: 29 % (ref 12–46)
Lymphs Abs: 1.7 10*3/uL (ref 0.7–4.0)
MCH: 30.5 pg (ref 26.0–34.0)
MCHC: 33 g/dL (ref 30.0–36.0)
MCV: 92.5 fL (ref 78.0–100.0)
MONO ABS: 0.5 10*3/uL (ref 0.1–1.0)
MPV: 9.8 fL (ref 8.6–12.4)
Monocytes Relative: 8 % (ref 3–12)
NEUTROS ABS: 3.5 10*3/uL (ref 1.7–7.7)
NEUTROS PCT: 58 % (ref 43–77)
Platelets: 260 10*3/uL (ref 150–400)
RBC: 5.21 MIL/uL (ref 4.22–5.81)
RDW: 14.8 % (ref 11.5–15.5)
WBC: 6 10*3/uL (ref 4.0–10.5)

## 2014-08-19 LAB — VITAMIN D 25 HYDROXY (VIT D DEFICIENCY, FRACTURES): Vit D, 25-Hydroxy: 77 ng/mL (ref 30–100)

## 2014-08-19 LAB — LIPID PANEL
CHOL/HDL RATIO: 2.6 ratio
Cholesterol: 115 mg/dL (ref 0–200)
HDL: 45 mg/dL (ref >=40)
LDL Cholesterol: 59 mg/dL (ref 0–99)
TRIGLYCERIDES: 54 mg/dL (ref <150)
VLDL: 11 mg/dL (ref 0–40)

## 2014-08-19 LAB — BASIC METABOLIC PANEL WITH GFR
BUN: 20 mg/dL (ref 6–23)
CO2: 26 mEq/L (ref 19–32)
Calcium: 9.2 mg/dL (ref 8.4–10.5)
Chloride: 102 mEq/L (ref 96–112)
Creat: 0.98 mg/dL (ref 0.50–1.35)
GFR, Est African American: 89 mL/min
GFR, Est Non African American: 77 mL/min
GLUCOSE: 91 mg/dL (ref 70–99)
POTASSIUM: 5.3 meq/L (ref 3.5–5.3)
Sodium: 140 mEq/L (ref 135–145)

## 2014-08-19 LAB — INSULIN, RANDOM: Insulin: 12 u[IU]/mL (ref 2.0–19.6)

## 2014-08-19 LAB — HEPATIC FUNCTION PANEL
ALBUMIN: 4.4 g/dL (ref 3.5–5.2)
ALK PHOS: 43 U/L (ref 39–117)
ALT: 20 U/L (ref 0–53)
AST: 14 U/L (ref 0–37)
BILIRUBIN INDIRECT: 0.3 mg/dL (ref 0.2–1.2)
Bilirubin, Direct: 0.1 mg/dL (ref 0.0–0.3)
TOTAL PROTEIN: 6.4 g/dL (ref 6.0–8.3)
Total Bilirubin: 0.4 mg/dL (ref 0.2–1.2)

## 2014-08-19 LAB — HEMOGLOBIN A1C
HEMOGLOBIN A1C: 6.3 % — AB (ref <5.7)
MEAN PLASMA GLUCOSE: 134 mg/dL — AB (ref <117)

## 2014-08-19 LAB — MAGNESIUM: Magnesium: 1.8 mg/dL (ref 1.5–2.5)

## 2014-08-19 LAB — TSH: TSH: 1.759 u[IU]/mL (ref 0.350–4.500)

## 2014-08-21 LAB — LEVETIRACETAM LEVEL: Keppra (Levetiracetam): 9.9 ug/mL

## 2015-02-22 ENCOUNTER — Encounter: Payer: Self-pay | Admitting: Internal Medicine

## 2015-02-22 ENCOUNTER — Ambulatory Visit (INDEPENDENT_AMBULATORY_CARE_PROVIDER_SITE_OTHER): Payer: Medicare Other | Admitting: Internal Medicine

## 2015-02-22 VITALS — BP 116/84 | HR 64 | Temp 97.5°F | Resp 16 | Ht 69.75 in | Wt 273.2 lb

## 2015-02-22 DIAGNOSIS — R7309 Other abnormal glucose: Secondary | ICD-10-CM | POA: Diagnosis not present

## 2015-02-22 DIAGNOSIS — E782 Mixed hyperlipidemia: Secondary | ICD-10-CM | POA: Diagnosis not present

## 2015-02-22 DIAGNOSIS — I10 Essential (primary) hypertension: Secondary | ICD-10-CM

## 2015-02-22 DIAGNOSIS — Z125 Encounter for screening for malignant neoplasm of prostate: Secondary | ICD-10-CM | POA: Diagnosis not present

## 2015-02-22 DIAGNOSIS — E559 Vitamin D deficiency, unspecified: Secondary | ICD-10-CM | POA: Diagnosis not present

## 2015-02-22 DIAGNOSIS — N320 Bladder-neck obstruction: Secondary | ICD-10-CM | POA: Diagnosis not present

## 2015-02-22 DIAGNOSIS — G40309 Generalized idiopathic epilepsy and epileptic syndromes, not intractable, without status epilepticus: Secondary | ICD-10-CM | POA: Diagnosis not present

## 2015-02-22 DIAGNOSIS — F329 Major depressive disorder, single episode, unspecified: Secondary | ICD-10-CM

## 2015-02-22 DIAGNOSIS — Z789 Other specified health status: Secondary | ICD-10-CM

## 2015-02-22 DIAGNOSIS — Z79899 Other long term (current) drug therapy: Secondary | ICD-10-CM | POA: Diagnosis not present

## 2015-02-22 DIAGNOSIS — I482 Chronic atrial fibrillation, unspecified: Secondary | ICD-10-CM

## 2015-02-22 DIAGNOSIS — Z1212 Encounter for screening for malignant neoplasm of rectum: Secondary | ICD-10-CM

## 2015-02-22 DIAGNOSIS — Z0000 Encounter for general adult medical examination without abnormal findings: Secondary | ICD-10-CM | POA: Insufficient documentation

## 2015-02-22 DIAGNOSIS — Z1331 Encounter for screening for depression: Secondary | ICD-10-CM

## 2015-02-22 DIAGNOSIS — Z9181 History of falling: Secondary | ICD-10-CM

## 2015-02-22 DIAGNOSIS — Z1389 Encounter for screening for other disorder: Secondary | ICD-10-CM | POA: Diagnosis not present

## 2015-02-22 DIAGNOSIS — R7303 Prediabetes: Secondary | ICD-10-CM | POA: Diagnosis not present

## 2015-02-22 DIAGNOSIS — F32A Depression, unspecified: Secondary | ICD-10-CM

## 2015-02-22 LAB — CBC WITH DIFFERENTIAL/PLATELET
BASOS ABS: 0.1 10*3/uL (ref 0.0–0.1)
BASOS PCT: 2 % — AB (ref 0–1)
Eosinophils Absolute: 0.2 10*3/uL (ref 0.0–0.7)
Eosinophils Relative: 3 % (ref 0–5)
HCT: 46.4 % (ref 39.0–52.0)
HEMOGLOBIN: 15.7 g/dL (ref 13.0–17.0)
Lymphocytes Relative: 28 % (ref 12–46)
Lymphs Abs: 1.5 10*3/uL (ref 0.7–4.0)
MCH: 30.7 pg (ref 26.0–34.0)
MCHC: 33.8 g/dL (ref 30.0–36.0)
MCV: 90.8 fL (ref 78.0–100.0)
MPV: 9.7 fL (ref 8.6–12.4)
Monocytes Absolute: 0.5 10*3/uL (ref 0.1–1.0)
Monocytes Relative: 10 % (ref 3–12)
NEUTROS PCT: 57 % (ref 43–77)
Neutro Abs: 3.1 10*3/uL (ref 1.7–7.7)
Platelets: 274 10*3/uL (ref 150–400)
RBC: 5.11 MIL/uL (ref 4.22–5.81)
RDW: 14.5 % (ref 11.5–15.5)
WBC: 5.4 10*3/uL (ref 4.0–10.5)

## 2015-02-22 LAB — BASIC METABOLIC PANEL WITH GFR
BUN: 14 mg/dL (ref 7–25)
CHLORIDE: 102 mmol/L (ref 98–110)
CO2: 29 mmol/L (ref 20–31)
CREATININE: 0.98 mg/dL (ref 0.70–1.18)
Calcium: 9.1 mg/dL (ref 8.6–10.3)
GFR, EST NON AFRICAN AMERICAN: 77 mL/min (ref >=60)
GFR, Est African American: 89 mL/min (ref >=60)
Glucose, Bld: 95 mg/dL (ref 65–99)
POTASSIUM: 4.9 mmol/L (ref 3.5–5.3)
SODIUM: 141 mmol/L (ref 135–146)

## 2015-02-22 LAB — LIPID PANEL
CHOL/HDL RATIO: 2.5 ratio (ref <=5.0)
CHOLESTEROL: 114 mg/dL — AB (ref 125–200)
HDL: 45 mg/dL (ref >=40)
LDL Cholesterol: 56 mg/dL (ref <130)
TRIGLYCERIDES: 65 mg/dL (ref <150)
VLDL: 13 mg/dL (ref <30)

## 2015-02-22 LAB — HEPATIC FUNCTION PANEL
ALBUMIN: 4.3 g/dL (ref 3.6–5.1)
ALT: 17 U/L (ref 9–46)
AST: 15 U/L (ref 10–35)
Alkaline Phosphatase: 49 U/L (ref 40–115)
Bilirubin, Direct: 0.1 mg/dL (ref <=0.2)
Indirect Bilirubin: 0.4 mg/dL (ref 0.2–1.2)
TOTAL PROTEIN: 6.6 g/dL (ref 6.1–8.1)
Total Bilirubin: 0.5 mg/dL (ref 0.2–1.2)

## 2015-02-22 LAB — TSH: TSH: 1.461 u[IU]/mL (ref 0.350–4.500)

## 2015-02-22 LAB — MAGNESIUM: MAGNESIUM: 1.9 mg/dL (ref 1.5–2.5)

## 2015-02-22 NOTE — Progress Notes (Signed)
Patient ID: George Delgado, male   DOB: [birth year], 69 y.o.   MRN: [id number]  Comprehensive Evaluation & Examination  This very nice 69 y.o. DWM presents for presents for comprehensive evaluation and management of multiple medical co-morbidities.  Patient has been followed for HTN, ASHD/Afib, ASCVD, Prediabetes, Hyperlipidemia and Vitamin D Deficiency. In 2013, patient had a non focal seizure. Patient is also followed at the [facility].    HTN predates since 1997. Patient's BP has been controlled at home.Today's BP: 116/84 mmHg. Patient had a non focal CVA in 2012. In 2013 he has a negative nuclear stress test. In 2015 he was found in rapid Afib and since been started on Xarelto.  Patient denies any cardiac symptoms as chest pain, palpitations, shortness of breath, dizziness or ankle swelling.   Patient's hyperlipidemia is controlled with diet and medications. Patient denies myalgias or other medication SE's. Last lipids were at goal with Cholesterol 115; HDL 45; LDL 59; Triglycerides 54 on 08/18/2014.   Patient has prediabetes with A1c 6.0% in 2011 and patient denies reactive hypoglycemic symptoms, visual blurring, diabetic polys or paresthesias. Last A1c was 6.3% on 08/18/2014.     Other problems include obstructive uropathy , improved since started on Tamsulosin.  Finally, patient has history of Vitamin D Deficiency and last vitamin D was 77 on 08/18/2014.    Medication Sig  . atorvastatin 80 MG tablet Take 1 tablet (80 mg total) by mouth daily.  . SYMBICORT  160-4.5  inhaler Inhale 2 puffs into the lungs 2 (two) times daily.  Marland Kitchen levETIRAcetam (KEPPRA) 500 MG tablet TAKE ONE TABLET BY MOUTH TWICE DAILY FOR  SEIZURES  . metoprolol succinate-XL) 25 MG  Take 25 mg by mouth 2 (two) times daily.  . Multiple Vitamin (MULTIVITAMIN) tablet Take 1 tablet by mouth daily.  . rivaroxaban (XARELTO) 20 MG TABS  Take 20 mg by mouth.   . sertraline (ZOLOFT) 100 MG tablet Take 2 tablets (200 mg total) by mouth daily.   . tamsulosin (FLOMAX) 0.4 MG CAPS  Take 1 capsule (0.4 mg total) by mouth daily after supper.   No Known Allergies   Past Medical History  Diagnosis Date  . Hypertension   . Stroke ([place])   . Hyperlipidemia   . COPD (chronic obstructive pulmonary disease) ([place])   . Depression    Health Maintenance  Topic Date Due  . Hepatitis C Screening  Oct 18, 1945  . ZOSTAVAX  07/08/2003  . INFLUENZA VACCINE  10/11/2014  . TETANUS/TDAP  03/13/2016  . COLONOSCOPY  06/03/2016  . PNA vac Low Risk Adult  Completed   Immunization History  Administered Date(s) Administered  . Influenza Whole 01/19/2011, 01/22/2012  . Influenza-Unspecified 12/28/2012, 12/10/2013  . Pneumococcal Conjugate-13 10/10/2013  . Pneumococcal-Unspecified 03/12/2009  . Td 03/12/2006   Past Surgical History  Procedure Laterality Date  . Ankle surgery    . Carotid endarterectomy      06/2010   Family History  Problem Relation Age of Onset  . Ovarian cancer Mother   . Hypertension Mother   . Hypertension Father   . Cancer Father   . Hypertension Sister   . Hypertension Brother   . Diabetes Brother   . Diabetes Daughter   . Diabetes Son     Social History   Social History  . Marital Status: Widowed    Spouse Name: N/A  . Number of Children: N/A  . Years of Education: N/A   Occupational History  . Not on file.  Social History Main Topics  . Smoking status: Former Smoker    Quit date: 05/11/2010  . Smokeless tobacco: Never Used  . Alcohol Use: No     Comment: rarely  . Drug Use: No  . Sexual Activity: No    ROS Constitutional: Denies fever, chills, weight loss/gain, headaches, insomnia,  night sweats or change in appetite. Does c/o fatigue. Eyes: Denies redness, blurred vision, diplopia, discharge, itchy or watery eyes.  ENT: Denies discharge, congestion, post nasal drip, epistaxis, sore throat, earache, hearing loss, dental pain, Tinnitus, Vertigo, Sinus pain or snoring.  Cardio: Denies chest  pain, palpitations, irregular heartbeat, syncope, dyspnea, diaphoresis, orthopnea, PND, claudication or edema Respiratory: denies cough, dyspnea, DOE, pleurisy, hoarseness, laryngitis or wheezing.  Gastrointestinal: Denies dysphagia, heartburn, reflux, water brash, pain, cramps, nausea, vomiting, bloating, diarrhea, constipation, hematemesis, melena, hematochezia, jaundice or hemorrhoids Genitourinary: Denies dysuria, frequency, urgency, nocturia, hesitancy, discharge, hematuria or flank pain Musculoskeletal: Denies arthralgia, myalgia, stiffness, Jt. Swelling, pain, limp or strain/sprain. Denies Falls. Skin: Denies puritis, rash, hives, warts, acne, eczema or change in skin lesion Neuro: No weakness, tremor, incoordination, spasms, paresthesia or pain Psychiatric: Denies confusion, memory loss or sensory loss. Denies Depression. Endocrine: Denies change in weight, skin, hair change, nocturia, and paresthesia, diabetic polys, visual blurring or hyper / hypo glycemic episodes.  Heme/Lymph: No excessive bleeding, bruising or enlarged lymph nodes.  Physical Exam  BP 116/84 mmHg  Pulse 64  Temp(Src) 97.5 F (36.4 C)  Resp 16  Ht 5' 9.75" (1.772 m)  Wt 273 lb 3.2 oz (123.923 kg)  BMI 39.47 kg/m2  General Appearance: Well nourished, in no apparent distress. Eyes: PERRLA, EOMs, conjunctiva no swelling or erythema, normal fundi and vessels. Sinuses: No frontal/maxillary tenderness ENT/Mouth: EACs patent / TMs  nl. Nares clear without erythema, swelling, mucoid exudates. Oral hygiene is good. No erythema, swelling, or exudate. Tongue normal, non-obstructing. Tonsils not swollen or erythematous. Hearing normal.  Neck: Supple, thyroid normal. No bruits, nodes or JVD. Respiratory: Respiratory effort normal.  BS equal and clear bilateral without rales, rhonci, wheezing or stridor. Cardio: Heart sounds are normal with regular rate and rhythm and no murmurs, rubs or gallops. Peripheral pulses are normal  and equal bilaterally without edema. No aortic or femoral bruits. Chest: symmetric with normal excursions and percussion.  Abdomen: Flat, soft, with bowl sounds. Nontender, no guarding, rebound, hernias, masses, or organomegaly.  Lymphatics: Non tender without lymphadenopathy.  Genitourinary: No hernias.Testes nl. DRE - prostate nl for age - smooth & firm w/o nodules. Musculoskeletal: Full ROM all peripheral extremities, joint stability, 5/5 strength, and normal gait. Skin: Warm and dry without rashes, lesions, cyanosis, clubbing or  ecchymosis.  Neuro: Cranial nerves intact, reflexes equal bilaterally. Normal muscle tone, no cerebellar symptoms. Sensation intact.  Pysch: Alert and oriented X 3 with normal affect, insight and judgment appropriate.   Assessment and Plan  1. Essential hypertension  - Microalbumin / creatinine urine ratio - EKG 12-Lead - [place], RETROPERITNL ABD,  LTD - TSH  2. Hyperlipidemia  - Lipid panel - TSH  3. Prediabetes  - Hemoglobin A1c - Insulin, random  4. Vitamin D deficiency  - VITAMIN D 25 Hydroxy   5. Other abnormal glucose  - Hemoglobin A1c - Insulin, random  6. Chronic a-fib ([place])   7. Generalized convulsive epilepsy ([place])  - Levetiracetam level  8. Depression, controlled   9. Screening for rectal cancer  - POC Hemoccult Bld/Stl   10. Morbid obesity, unspecified obesity type ([place])   11.  Prostate cancer screening  - PSA  12. Depression screen   13. At low risk for fall   14. Bladder neck obstruction  - PSA  15. Medication management  - Urinalysis, Routine w reflex microscopic  - CBC with Differential/Platelet - BASIC METABOLIC PANEL WITH GFR - Hepatic function panel - Magnesium - Levetiracetam level   Continue prudent diet as discussed, weight control, BP monitoring, regular exercise, and medications as discussed.  Discussed med effects and SE's. Routine screening labs and tests as requested with regular  follow-up as recommended. Over 40 minutes of exam, counseling, chart review and high complex critical decision making was performed

## 2015-02-22 NOTE — Patient Instructions (Signed)

## 2015-02-23 LAB — URINALYSIS, ROUTINE W REFLEX MICROSCOPIC
Bilirubin Urine: NEGATIVE
Glucose, UA: NEGATIVE
Hgb urine dipstick: NEGATIVE
Ketones, ur: NEGATIVE
Leukocytes, UA: NEGATIVE
Nitrite: NEGATIVE
Protein, ur: NEGATIVE
Specific Gravity, Urine: 1.014 (ref 1.001–1.035)
pH: 5.5 (ref 5.0–8.0)

## 2015-02-23 LAB — PSA: PSA: 0.55 ng/mL

## 2015-02-23 LAB — MICROALBUMIN / CREATININE URINE RATIO
Creatinine, Urine: 154 mg/dL (ref 20–370)
Microalb Creat Ratio: 27 ug/mg{creat}
Microalb, Ur: 4.2 mg/dL

## 2015-02-23 LAB — VITAMIN D 25 HYDROXY (VIT D DEFICIENCY, FRACTURES): VIT D 25 HYDROXY: 73 ng/mL (ref 30–100)

## 2015-02-23 LAB — INSULIN, RANDOM: Insulin: 13.4 u[IU]/mL (ref 2.0–19.6)

## 2015-02-23 LAB — HEMOGLOBIN A1C
HEMOGLOBIN A1C: 6.1 % — AB (ref <5.7)
Mean Plasma Glucose: 128 mg/dL — ABNORMAL HIGH (ref <117)

## 2015-02-25 LAB — LEVETIRACETAM LEVEL: KEPPRA (LEVETIRACETAM): 9.5 ug/mL

## 2015-04-13 LAB — HM COLONOSCOPY

## 2015-05-26 ENCOUNTER — Ambulatory Visit: Payer: Self-pay | Admitting: Internal Medicine

## 2015-07-25 ENCOUNTER — Other Ambulatory Visit: Payer: Self-pay | Admitting: Physician Assistant

## 2015-07-25 MED ORDER — MONTELUKAST SODIUM 10 MG PO TABS: 10.0000 mg | ORAL_TABLET | Freq: Every day | ORAL | Status: DC

## 2015-07-25 MED ORDER — SERTRALINE HCL 100 MG PO TABS: 200.0000 mg | ORAL_TABLET | Freq: Every day | ORAL | Status: DC

## 2015-07-25 MED ORDER — METOPROLOL SUCCINATE ER 25 MG PO TB24: 25.0000 mg | ORAL_TABLET | Freq: Two times a day (BID) | ORAL | Status: DC

## 2015-07-25 MED ORDER — PREDNISONE 20 MG PO TABS: ORAL_TABLET | ORAL | Status: DC

## 2015-07-25 MED ORDER — ATORVASTATIN CALCIUM 80 MG PO TABS: 80.0000 mg | ORAL_TABLET | Freq: Every day | ORAL | Status: AC

## 2015-07-25 MED ORDER — LEVETIRACETAM 500 MG PO TABS: ORAL_TABLET | ORAL | Status: DC

## 2015-07-25 MED ORDER — AZITHROMYCIN 250 MG PO TABS: ORAL_TABLET | ORAL | Status: AC

## 2015-07-25 MED ORDER — DOXAZOSIN MESYLATE 8 MG PO TABS: 8.0000 mg | ORAL_TABLET | Freq: Every day | ORAL | Status: AC

## 2015-08-30 ENCOUNTER — Ambulatory Visit: Payer: Self-pay | Admitting: Internal Medicine

## 2015-12-01 ENCOUNTER — Ambulatory Visit (INDEPENDENT_AMBULATORY_CARE_PROVIDER_SITE_OTHER): Payer: Medicare Other | Admitting: Physician Assistant

## 2015-12-01 ENCOUNTER — Encounter: Payer: Self-pay | Admitting: Physician Assistant

## 2015-12-01 VITALS — BP 136/68 | HR 90 | Temp 97.5°F | Resp 16 | Wt 289.0 lb

## 2015-12-01 DIAGNOSIS — F3341 Major depressive disorder, recurrent, in partial remission: Secondary | ICD-10-CM

## 2015-12-01 DIAGNOSIS — E559 Vitamin D deficiency, unspecified: Secondary | ICD-10-CM | POA: Diagnosis not present

## 2015-12-01 DIAGNOSIS — R6889 Other general symptoms and signs: Secondary | ICD-10-CM | POA: Diagnosis not present

## 2015-12-01 DIAGNOSIS — I482 Chronic atrial fibrillation, unspecified: Secondary | ICD-10-CM

## 2015-12-01 DIAGNOSIS — E782 Mixed hyperlipidemia: Secondary | ICD-10-CM | POA: Diagnosis not present

## 2015-12-01 DIAGNOSIS — Z0001 Encounter for general adult medical examination with abnormal findings: Secondary | ICD-10-CM

## 2015-12-01 DIAGNOSIS — I739 Peripheral vascular disease, unspecified: Secondary | ICD-10-CM

## 2015-12-01 DIAGNOSIS — G40309 Generalized idiopathic epilepsy and epileptic syndromes, not intractable, without status epilepticus: Secondary | ICD-10-CM | POA: Diagnosis not present

## 2015-12-01 DIAGNOSIS — Z0000 Encounter for general adult medical examination without abnormal findings: Secondary | ICD-10-CM

## 2015-12-01 DIAGNOSIS — Z79899 Other long term (current) drug therapy: Secondary | ICD-10-CM

## 2015-12-01 DIAGNOSIS — Z23 Encounter for immunization: Secondary | ICD-10-CM | POA: Diagnosis not present

## 2015-12-01 DIAGNOSIS — I10 Essential (primary) hypertension: Secondary | ICD-10-CM | POA: Diagnosis not present

## 2015-12-01 DIAGNOSIS — N400 Enlarged prostate without lower urinary tract symptoms: Secondary | ICD-10-CM

## 2015-12-01 DIAGNOSIS — J449 Chronic obstructive pulmonary disease, unspecified: Secondary | ICD-10-CM

## 2015-12-01 DIAGNOSIS — I2510 Atherosclerotic heart disease of native coronary artery without angina pectoris: Secondary | ICD-10-CM | POA: Diagnosis not present

## 2015-12-01 DIAGNOSIS — H1013 Acute atopic conjunctivitis, bilateral: Secondary | ICD-10-CM

## 2015-12-01 DIAGNOSIS — G4733 Obstructive sleep apnea (adult) (pediatric): Secondary | ICD-10-CM | POA: Diagnosis not present

## 2015-12-01 DIAGNOSIS — R7303 Prediabetes: Secondary | ICD-10-CM | POA: Diagnosis not present

## 2015-12-01 DIAGNOSIS — N320 Bladder-neck obstruction: Secondary | ICD-10-CM

## 2015-12-01 DIAGNOSIS — Z9989 Dependence on other enabling machines and devices: Secondary | ICD-10-CM

## 2015-12-01 LAB — CBC WITH DIFFERENTIAL/PLATELET
Basophils Absolute: 57 cells/uL (ref 0–200)
Basophils Relative: 1 %
EOS PCT: 2 %
Eosinophils Absolute: 114 cells/uL (ref 15–500)
HEMATOCRIT: 43.6 % (ref 38.5–50.0)
Hemoglobin: 14.5 g/dL (ref 13.2–17.1)
LYMPHS PCT: 22 %
Lymphs Abs: 1254 cells/uL (ref 850–3900)
MCH: 30.3 pg (ref 27.0–33.0)
MCHC: 33.3 g/dL (ref 32.0–36.0)
MCV: 91.2 fL (ref 80.0–100.0)
MPV: 10 fL (ref 7.5–12.5)
Monocytes Absolute: 627 cells/uL (ref 200–950)
Monocytes Relative: 11 %
NEUTROS PCT: 64 %
Neutro Abs: 3648 cells/uL (ref 1500–7800)
Platelets: 252 10*3/uL (ref 140–400)
RBC: 4.78 MIL/uL (ref 4.20–5.80)
RDW: 15 % (ref 11.0–15.0)
WBC: 5.7 10*3/uL (ref 3.8–10.8)

## 2015-12-01 LAB — LIPID PANEL
CHOLESTEROL: 122 mg/dL — AB (ref 125–200)
HDL: 45 mg/dL (ref >=40)
LDL Cholesterol: 61 mg/dL (ref <130)
TRIGLYCERIDES: 80 mg/dL (ref <150)
Total CHOL/HDL Ratio: 2.7 Ratio (ref <=5.0)
VLDL: 16 mg/dL (ref <30)

## 2015-12-01 LAB — BASIC METABOLIC PANEL WITH GFR
BUN: 15 mg/dL (ref 7–25)
CALCIUM: 9 mg/dL (ref 8.6–10.3)
CO2: 30 mmol/L (ref 20–31)
CREATININE: 0.97 mg/dL (ref 0.70–1.18)
Chloride: 104 mmol/L (ref 98–110)
GFR, EST NON AFRICAN AMERICAN: 78 mL/min (ref >=60)
GLUCOSE: 109 mg/dL — AB (ref 65–99)
Potassium: 4.9 mmol/L (ref 3.5–5.3)
Sodium: 143 mmol/L (ref 135–146)

## 2015-12-01 LAB — HEPATIC FUNCTION PANEL
ALBUMIN: 4.2 g/dL (ref 3.6–5.1)
ALT: 19 U/L (ref 9–46)
AST: 15 U/L (ref 10–35)
Alkaline Phosphatase: 47 U/L (ref 40–115)
BILIRUBIN INDIRECT: 0.4 mg/dL (ref 0.2–1.2)
Bilirubin, Direct: 0.1 mg/dL (ref <=0.2)
TOTAL PROTEIN: 6.4 g/dL (ref 6.1–8.1)
Total Bilirubin: 0.5 mg/dL (ref 0.2–1.2)

## 2015-12-01 LAB — TSH: TSH: 2.06 m[IU]/L (ref 0.40–4.50)

## 2015-12-01 MED ORDER — PHENTERMINE HCL 37.5 MG PO TABS: 37.5000 mg | ORAL_TABLET | Freq: Every day | ORAL | 2 refills | Status: DC

## 2015-12-01 MED ORDER — NEOMYCIN-POLYMYXIN-DEXAMETH 3.5-10000-0.1 OP SUSP: 1.0000 [drp] | Freq: Four times a day (QID) | OPHTHALMIC | 1 refills | Status: DC

## 2015-12-01 NOTE — Progress Notes (Signed)
Patient ID: George Delgado, male   DOB: 11-09-1945, 70 y.o.   MRN: [id number]  MEDICARE ANNUAL WELLNESS VISIT AND OV  Assessment:    Essential hypertension - continue medications, DASH diet, exercise and monitor at home. Call if greater than 130/80.  -     CBC with Differential/Platelet -     BASIC METABOLIC PANEL WITH GFR -     Hepatic function panel -     TSH  ASCVD (arteriosclerotic cardiovascular disease) Control blood pressure, cholesterol, glucose, increase exercise.  Chronic a-fib (HCC)  continue rate control medication xarelto ? Need to increase toprol to 50 at night versus GERD  OSA on mouth piece Weight loss advised  Generalized convulsive epilepsy ([place]) Check keppra level  BPH (benign prostatic hyperplasia) Continue doxazosin  Hyperlipidemia -continue medications, check lipids, decrease fatty foods, increase activity.  -     Lipid panel  Prediabetes Discussed general issues about diabetes pathophysiology and management., Educational material distributed., Suggested low cholesterol diet., Encouraged aerobic exercise., Discussed foot care., Reminded to get yearly retinal exam. -     Hemoglobin A1c  Vitamin D deficiency  Morbid obesity, unspecified obesity type ([place]) Obesity with co morbidities- long discussion about weight loss, diet, and exercise If any worsening palps/HR will stop, has tolerated in the past -     phentermine (ADIPEX-P) 37.5 MG tablet; Take 1 tablet (37.5 mg total) by mouth daily before breakfast.  Medication management -     Levetiracetam level  Recurrent major depressive disorder, in partial remission ([place]) Continue zoloft  Encounter for Medicare annual wellness exam  Bladder neck obstruction Continue doxazosin  Chronic obstructive pulmonary disease, unspecified COPD type ([place]) will get CXR at [facility] hospital, continue spiriva once daily, may need to add on breo or similar   Needs flu shot -     Flu vaccine HIGH DOSE PF  Allergic  conjunctivitis, bilateral -     neomycin-polymyxin b-dexamethasone (MAXITROL) 3.5-10000-0.1 SUSP; Place 1 drop into both eyes 4 (four) times daily. - any worsening vision, eye pain see eye doctor  Claudication [facility]) Continue xarelto, get ABI at [place], continue walking     Plan:   During the course of the visit the patient was educated and counseled about appropriate screening and preventive services including:    Pneumococcal vaccine   Influenza vaccine  Td vaccine  Screening electrocardiogram  Bone densitometry screening  Colorectal cancer screening  Diabetes screening  Glaucoma screening  Nutrition counseling   Advanced directives: requested  Subjective:   George Delgado  presents for Medicare Annual Wellness Visit and complete physical.  Date of last medicare wellness visit was 05/01/2013. This very nice 70 y.o. MWM presents for 3 month follow up with Hypertension, Hyperlipidemia, Pre-Diabetes and Vitamin D Deficiency.   He complains of bilateral eye redness x 2 weeks, with floaters'spots moving around his eyes, watery, itching, has been using optic drops in them x 3 days that helps some but goes away. He follows with eye doctor at [facility], had cataract surgery 8-12 months ago.   Patient is treated for HTN & BP has been controlled at home. Today's BP: 136/68. He has a history of CVA s/p left CEA. He is on Keppra for seizures due to the CVA, he continues to have some memory and speech issues. He has history of Afib with RVR, his rate is controlled and he is on Xarelto, follows with cardiology at the [facility] hospital. Normal Stress test 07/2011.  Patient has had no complaints of  any cardiac type chest pain, palpitations, dyspnea/orthopnea/PND, dizziness, claudication, or dependent edema. Will get cramping left leg greater than right leg after walking about 1/8 of a mile, when he stops it is better.   He has COPD, some baseline shortness of breath and coughing, has had  pulmonary function test, last CXR long time. He is on spriva respimet once daily that helps.   Hyperlipidemia is controlled with diet & meds. Patient denies myalgias or other med SE's.  Lab Results  Component Value Date   CHOL 114 (L) 02/22/2015   HDL 45 02/22/2015   LDLCALC 56 02/22/2015   TRIG 65 02/22/2015   CHOLHDL 2.5 02/22/2015   Also, the patient has history of PreDiabetes and has had no symptoms of reactive hypoglycemia, diabetic polys, paresthesias or visual blurring.   Lab Results  Component Value Date   HGBA1C 6.1 (H) 02/22/2015   Further, the patient also has history of Vitamin D Deficiency and supplements vitamin D without any suspected side-effects. Lab Results  Component Value Date   VD25OH 86 02/22/2015   He is on zoloft for depression and states that it is controlled.   He has BPH symptoms, is on doxazosin.   BMI is Body mass index is 41.76 kg/m., he is working on diet and exercise. He has OSA and has a CPAP but states that he is unable to tolerate it. Working the [place] now to try to get better mask/device.  Wt Readings from Last 3 Encounters:  12/01/15 289 lb (131.1 kg)  02/22/15 273 lb 3.2 oz (123.9 kg)  08/18/14 266 lb 3.2 oz (120.7 kg)   He has chronic pain, has kyphosis with his back. Has lower back pain with left leg weakness/tingling occ. Neck pain at night mainly.    Medication Review: Current Outpatient Prescriptions on File Prior to Visit  Medication Sig  . atorvastatin (LIPITOR) 80 MG tablet Take 1 tablet (80 mg total) by mouth daily.  . budesonide-formoterol (SYMBICORT) 160-4.5 MCG/ACT inhaler Inhale 2 puffs into the lungs 2 (two) times daily.  . Cholecalciferol (VITAMIN D PO) Take 2,000 Units by mouth 2 (two) times daily.  Marland Kitchen doxazosin (CARDURA) 8 MG tablet Take 1 tablet (8 mg total) by mouth daily.  Marland Kitchen levETIRAcetam (KEPPRA) 500 MG tablet TAKE ONE TABLET BY MOUTH TWICE DAILY FOR  SEIZURES  . metoprolol succinate (TOPROL-XL) 25 MG 24 hr tablet Take 1  tablet (25 mg total) by mouth 2 (two) times daily.  . montelukast (SINGULAIR) 10 MG tablet Take 1 tablet (10 mg total) by mouth daily.  . Multiple Vitamin (MULTIVITAMIN) tablet Take 1 tablet by mouth daily.  . rivaroxaban (XARELTO) 20 MG TABS tablet Take 20 mg by mouth.   . sertraline (ZOLOFT) 100 MG tablet Take 2 tablets (200 mg total) by mouth daily.   No current facility-administered medications on file prior to visit.     Current Problems (verified) Patient Active Problem List   Diagnosis Date Noted  . Encounter for Medicare annual wellness exam 02/22/2015  . Major depression in partial remission ([place]) 08/18/2014  . Chronic a-fib ([place]) 08/18/2014  . BPH (benign prostatic hyperplasia) 08/18/2014  . Medication management 07/07/2013  . Vitamin D deficiency 04/30/2013  . Morbid obesity (BMI 37.97) 04/30/2013  . Generalized convulsive epilepsy ([place]) 01/29/2013  . OSA on CPAP 01/29/2013  . Essential hypertension 01/28/2013  . ASCVD (arteriosclerotic cardiovascular disease) 01/28/2013  . Hyperlipidemia 01/28/2013  . Prediabetes 01/28/2013   Screening Tests Immunization History  Administered Date(s) Administered  .  Influenza Whole 01/19/2011, 01/22/2012  . Influenza, High Dose Seasonal PF 12/01/2015  . Influenza-Unspecified 12/28/2012, 12/10/2013  . Pneumococcal Conjugate-13 10/10/2013  . Pneumococcal-Unspecified 03/12/2009  . Td 03/12/2006   Preventative care: Last colonoscopy: 12/2013 at the [place] due 3 years  Vaccinations: TD or Tdap: 2008  Influenza: 12/2014 Prevnar 13: 2015 Pneumococcal: 2011 Shingles/Zostavax: 2016 at [place] of Other Physician/Practitioners you currently use: 1. [facility] here for primary care  at the [facility] hospital, follows with eye doctor there,  last visit 04/2015 Follows with cardiologist, PCP, and GI at the [facility] hospital.  Patient Care Team: Unk Pinto, MD as PCP - General (Internal  Medicine)  Allergies No Known Allergies  SURGICAL HISTORY He  has a past surgical history that includes Ankle surgery and Carotid endarterectomy. FAMILY HISTORY His family history includes Cancer in his father; Diabetes in his brother, daughter, and son; Hypertension in his brother, father, mother, and sister; Ovarian cancer in his mother. SOCIAL HISTORY He  reports that he quit smoking about 5 years ago. He has never used smokeless tobacco. He reports that he does not drink alcohol or use drugs.  MEDICARE WELLNESS OBJECTIVES: Physical activity: Current Exercise Habits: Home exercise routine, Type of exercise: walking, Time (Minutes): 30, Frequency (Times/Week): 3, Weekly Exercise (Minutes/Week): 90, Intensity: Mild Cardiac risk factors: Cardiac Risk Factors include: advanced age (>93men, >25 women);dyslipidemia;family history of premature cardiovascular disease;hypertension;male gender;sedentary lifestyle;obesity (BMI >30kg/m2) Depression/mood screen:   Depression screen [facility] 2/9 12/01/2015  Decreased Interest 0  Down, Depressed, Hopeless 0  PHQ - 2 Score 0    ADLs:  In your present state of health, do you have any difficulty performing the following activities: 12/01/2015 02/22/2015  Hearing? Y N  Vision? N N  Difficulty concentrating or making decisions? Y N  Walking or climbing stairs? Y N  Dressing or bathing? N N  Doing errands, shopping? Y N  Preparing Food and eating ? Y -  Using the Toilet? N -  In the past six months, have you accidently leaked urine? N -  Do you have problems with loss of bowel control? N -  Managing your Medications? N -  Managing your Finances? N -  Housekeeping or managing your Housekeeping? N -  Some recent data might be hidden     Cognitive Testing  Alert? Yes  Normal Appearance?Yes  Oriented to person? Yes  Place? Yes   Time? Yes  Recall of three objects?  Yes  Can perform simple calculations? Yes  Displays appropriate judgment?Yes  Can  read the correct time from a watch face?Yes  EOL planning: Does patient have an advance directive?: Yes Type of Advance Directive: Healthcare Power of Attorney, Living will Does patient want to make changes to advanced directive?: No - Patient declined Copy of advanced directive(s) in chart?: No - copy requested   Review of Systems  Constitutional: Positive for malaise/fatigue (with exertion). Negative for chills, diaphoresis, fever and weight loss.  Eyes: Negative.  Negative for blurred vision and double vision.  Respiratory: Positive for shortness of breath (with exertion occ) and wheezing (better). Negative for cough, hemoptysis and sputum production.   Cardiovascular: Negative for chest pain, palpitations, orthopnea, claudication, leg swelling and PND.  Gastrointestinal: Negative.  Negative for abdominal pain, blood in stool, constipation, diarrhea, heartburn, melena and nausea.  Genitourinary: Negative for dysuria, flank pain, frequency, hematuria and urgency.       Nocturia x 1  Musculoskeletal: Positive for back pain,  joint pain (left knee pain worse), myalgias (with walking) and neck pain. Negative for falls.  Skin: Negative.   Neurological: Negative.  Negative for dizziness, seizures and weakness.  Psychiatric/Behavioral: Positive for memory loss. Negative for depression, hallucinations, substance abuse and suicidal ideas. The patient is not nervous/anxious and does not have insomnia.      Objective:     BP 136/68   Pulse 90   Temp 97.5 F (36.4 C)   Resp 16   Wt 289 lb (131.1 kg)   SpO2 97%   BMI 41.76 kg/m   General Appearance:  Alert  WD/WN, male  in no apparent distress. Eyes: PERRLA, EOMs nl, conjunctiva erythematous without discharge, soft globes Sinuses: No frontal/maxillary tenderness ENT/Mouth: EACs patent / TMs  nl. Nares clear without erythema, swelling, mucoid exudates. Oral hygiene is good. No erythema, swelling, or exudate. Tongue normal, non-obstructing.  Tonsils not swollen or erythematous. Hearing decreased.  Neck: Supple, thyroid normal. No bruits, nodes or JVD. Well healed scar on over left carotid.  Respiratory: Respiratory effort normal.  BS equal and clear bilateral without rales, rhonci, wheezing or stridor. Cardio: Heart sounds are normal with normal rate and  irregular rhythm with high pitched systolic murmur without rubs or gallops. Decreased hair bilateral legs, decreased TP/DP bilaterally with 1+edema. No aortic or femoral bruits. Chest: symmetric with normal excursions and percussion.  Abdomen: Flat, soft, with nl bowel sounds. Nontender, no guarding, rebound, hernias, masses, or organomegaly.  Lymphatics: Non tender without lymphadenopathy.  Genitourinary: No hernias. Musculoskeletal: Cervical kyphosis. Full ROM all peripheral extremities, joint stability, 5/5 strength, and antalgic gait. Skin: Warm and dry without rashes, lesions, cyanosis, clubbing or  ecchymosis.  Neuro: Cranial nerves intact, reflexes equal bilaterally. Normal muscle tone, no cerebellar symptoms. Sensation intact.  Pysch: Alert and oriented X 3 with normal affect, insight and judgment appropriate.    Medicare Attestation I have personally reviewed: The patient's medical and social history Their use of alcohol, tobacco or illicit drugs Their current medications and supplements The patient's functional ability including ADLs,fall risks, home safety risks, cognitive, and hearing and visual impairment Diet and physical activities Evidence for depression or mood disorders  The patient's weight, height, BMI, and visual acuity have been recorded in the chart.  I have made referrals, counseling, and provided education to the patient based on review of the above and I have provided the patient with a written personalized care plan for preventive services.  Over 40 minutes of exam, counseling, chart review was performed.  Vicie Mutters, PA-C   12/01/2015

## 2015-12-01 NOTE — Patient Instructions (Addendum)
Suggest ABI for possible claudication left leg Suggest carotid US bilateral- has not had follow up/imaging in 5+ years s/p left CEA, had 40-50% blockage 5 + years ago on right Suggest chest xray or low dose CT scan screen for lung cancer (meets criteria)  Add on Zantac 150mg  AM and PM x 2 weeks, then with night time meds May need to increase toprol to 25 AM and 50mg  PM    Allergic Conjunctivitis Allergic conjunctivitis is inflammation of the clear membrane that covers the white part of your eye and the inner surface of your eyelid (conjunctiva), and it is caused by allergies. The blood vessels in the conjunctiva become inflamed, and this causes the eye to become red or pink, and it often causes itchiness in the eye. Allergic conjunctivitis cannot be spread by one person to another person (noncontagious). CAUSES This condition is caused by an allergic reaction. Common causes of an allergic reaction (allergens) include:  Dust.  Pollen.  Mold.  Animal dander or secretions. RISK FACTORS This condition is more likely to develop if you are exposed to high levels of allergens that cause the allergic reaction. This might include being outdoors when air pollen levels are high or being around animals that you are allergic to. SYMPTOMS Symptoms of this condition may include:  Eye redness.  Tearing of the eyes.  Watery eyes.  Itchy eyes.  Burning feeling in the eyes.  Clear drainage from the eyes.  Swollen eyelids. DIAGNOSIS This condition may be diagnosed by medical history and physical exam. If you have drainage from your eyes, it may be tested to rule out other causes of conjunctivitis. TREATMENT Treatment for this condition often includes medicines. These may be eye drops, ointments, or oral medicines. They may be prescription medicines or over-the-counter medicines. HOME CARE INSTRUCTIONS  Take or apply medicines only as directed by your health care provider.  Do not touch or  rub your eyes.  Do not wear contact lenses until the inflammation is gone. Wear glasses instead.  Do not wear eye makeup until the inflammation is gone.  Apply a cool, clean washcloth to your eye for 10-20 minutes, 3-4 times a day.  Try to avoid whatever allergen is causing the allergic reaction. SEEK MEDICAL CARE IF:  Your symptoms get worse.  You have pus draining from your eye.  You have new symptoms.  You have a fever.   This information is not intended to replace advice given to you by your health care provider. Make sure you discuss any questions you have with your health care provider.   Document Released: 05/19/2002 Document Revised: 03/19/2014 Document Reviewed: 12/08/2013 Elsevier Interactive Patient Education 2016 [employer].   Phentermine  While taking the medication we may ask that you come into the office once a month or once every 2-3 months to monitor your weight, blood pressure, and heart rate. In addition we can help answer your questions about diet, exercise, and help you every step of the way with your weight loss journey. Sometime it is helpful if you bring in a food diary or use an app on your phone such as myfitnesspal to record your calorie intake, especially in the beginning.   You can start out on 1/3 to 1/2 a pill in the morning and if you are tolerating it well you can increase to one pill daily. I also have some patients that take 1/3 or 1/2 at lunch to help prevent night time eating.  This medication is cheapest  CASH pay at [facility] is 14-17 dollars and you do NOT need a membership to get meds from there.    What is this medicine? PHENTERMINE (FEN ter meen) decreases your appetite. This medicine is intended to be used in addition to a healthy reduced calorie diet and exercise. The best results are achieved this way. This medicine is only indicated for short-term use. Eventually your weight loss may level out and the medication  will no longer be needed.   How should I use this medicine? Take this medicine by mouth. Follow the directions on the prescription label. The tablets should stay in the bottle until immediately before you take your dose. Take your doses at regular intervals. Do not take your medicine more often than directed.  Overdosage: If you think you have taken too much of this medicine contact a poison control center or emergency room at once. NOTE: This medicine is only for you. Do not share this medicine with others.  What if I miss a dose? If you miss a dose, take it as soon as you can. If it is almost time for your next dose, take only that dose. Do not take double or extra doses. Do not increase or in any way change your dose without consulting your doctor.  What should I watch for while using this medicine? Notify your physician immediately if you become short of breath while doing your normal activities. Do not take this medicine within 6 hours of bedtime. It can keep you from getting to sleep. Avoid drinks that contain caffeine and try to stick to a regular bedtime every night. Do not stand or sit up quickly, especially if you are an older patient. This reduces the risk of dizzy or fainting spells. Avoid alcoholic drinks.  What side effects may I notice from receiving this medicine? Side effects that you should report to your doctor or health care professional as soon as possible: -chest pain, palpitations -depression or severe changes in mood -increased blood pressure -irritability -nervousness or restlessness -severe dizziness -shortness of breath -problems urinating -unusual swelling of the legs -vomiting  Side effects that usually do not require medical attention (report to your doctor or health care professional if they continue or are bothersome): -blurred vision or other eye problems -changes in sexual ability or desire -constipation or diarrhea -difficulty sleeping -dry mouth or  unpleasant taste -headache -nausea This list may not describe all possible side effects. Call your doctor for medical advice about side effects. You may report side effects to FDA at 1-800-FDA-1088.

## 2015-12-02 LAB — HEMOGLOBIN A1C
Hgb A1c MFr Bld: 5.9 % — ABNORMAL HIGH (ref <5.7)
MEAN PLASMA GLUCOSE: 123 mg/dL

## 2015-12-02 LAB — MICROALBUMIN / CREATININE URINE RATIO
Creatinine, Urine: 174 mg/dL (ref 20–370)
Microalb Creat Ratio: 7 mcg/mg creat (ref <30)
Microalb, Ur: 1.2 mg/dL

## 2015-12-05 LAB — LEVETIRACETAM LEVEL: Keppra (Levetiracetam): 12.4 ug/mL

## 2016-02-18 ENCOUNTER — Other Ambulatory Visit: Payer: Self-pay | Admitting: Physician Assistant

## 2016-02-18 MED ORDER — LEVETIRACETAM 500 MG PO TABS: ORAL_TABLET | ORAL | 4 refills | Status: DC

## 2016-03-14 ENCOUNTER — Ambulatory Visit: Payer: Self-pay | Admitting: Internal Medicine

## 2016-03-14 DIAGNOSIS — R569 Unspecified convulsions: Secondary | ICD-10-CM | POA: Insufficient documentation

## 2016-03-14 NOTE — Progress Notes (Signed)
NO SHOW

## 2016-04-02 ENCOUNTER — Other Ambulatory Visit: Payer: Self-pay | Admitting: Physician Assistant

## 2016-04-17 ENCOUNTER — Encounter: Payer: Self-pay | Admitting: Internal Medicine

## 2016-04-17 ENCOUNTER — Ambulatory Visit (INDEPENDENT_AMBULATORY_CARE_PROVIDER_SITE_OTHER): Payer: Medicare Other | Admitting: Internal Medicine

## 2016-04-17 VITALS — BP 120/72 | HR 72 | Temp 97.3°F | Resp 16 | Ht 69.75 in | Wt 291.0 lb

## 2016-04-17 DIAGNOSIS — E559 Vitamin D deficiency, unspecified: Secondary | ICD-10-CM

## 2016-04-17 DIAGNOSIS — I10 Essential (primary) hypertension: Secondary | ICD-10-CM

## 2016-04-17 DIAGNOSIS — E782 Mixed hyperlipidemia: Secondary | ICD-10-CM | POA: Diagnosis not present

## 2016-04-17 DIAGNOSIS — R7303 Prediabetes: Secondary | ICD-10-CM | POA: Diagnosis not present

## 2016-04-17 DIAGNOSIS — G40309 Generalized idiopathic epilepsy and epileptic syndromes, not intractable, without status epilepticus: Secondary | ICD-10-CM

## 2016-04-17 DIAGNOSIS — Z79899 Other long term (current) drug therapy: Secondary | ICD-10-CM

## 2016-04-17 LAB — CBC WITH DIFFERENTIAL/PLATELET
Basophils Absolute: 94 cells/uL (ref 0–200)
Basophils Relative: 2 %
EOS PCT: 4 %
Eosinophils Absolute: 188 cells/uL (ref 15–500)
HCT: 45.4 % (ref 38.5–50.0)
Hemoglobin: 15 g/dL (ref 13.2–17.1)
LYMPHS ABS: 1363 {cells}/uL (ref 850–3900)
LYMPHS PCT: 29 %
MCH: 30.1 pg (ref 27.0–33.0)
MCHC: 33 g/dL (ref 32.0–36.0)
MCV: 91.2 fL (ref 80.0–100.0)
MPV: 9.6 fL (ref 7.5–12.5)
Monocytes Absolute: 470 cells/uL (ref 200–950)
Monocytes Relative: 10 %
NEUTROS PCT: 55 %
Neutro Abs: 2585 cells/uL (ref 1500–7800)
Platelets: 242 10*3/uL (ref 140–400)
RBC: 4.98 MIL/uL (ref 4.20–5.80)
RDW: 14.3 % (ref 11.0–15.0)
WBC: 4.7 10*3/uL (ref 3.8–10.8)

## 2016-04-17 LAB — HEPATIC FUNCTION PANEL
ALT: 17 U/L (ref 9–46)
AST: 14 U/L (ref 10–35)
Albumin: 4.4 g/dL (ref 3.6–5.1)
Alkaline Phosphatase: 49 U/L (ref 40–115)
BILIRUBIN DIRECT: 0.1 mg/dL (ref <=0.2)
BILIRUBIN INDIRECT: 0.4 mg/dL (ref 0.2–1.2)
TOTAL PROTEIN: 6.7 g/dL (ref 6.1–8.1)
Total Bilirubin: 0.5 mg/dL (ref 0.2–1.2)

## 2016-04-17 LAB — BASIC METABOLIC PANEL WITH GFR
BUN: 21 mg/dL (ref 7–25)
CALCIUM: 9.6 mg/dL (ref 8.6–10.3)
CO2: 32 mmol/L — ABNORMAL HIGH (ref 20–31)
Chloride: 105 mmol/L (ref 98–110)
Creat: 1.14 mg/dL (ref 0.70–1.18)
GFR, EST AFRICAN AMERICAN: 74 mL/min (ref >=60)
GFR, EST NON AFRICAN AMERICAN: 64 mL/min (ref >=60)
Glucose, Bld: 107 mg/dL — ABNORMAL HIGH (ref 65–99)
Potassium: 5.5 mmol/L — ABNORMAL HIGH (ref 3.5–5.3)
SODIUM: 145 mmol/L (ref 135–146)

## 2016-04-17 LAB — LIPID PANEL
CHOL/HDL RATIO: 2.8 ratio (ref <5.0)
CHOLESTEROL: 125 mg/dL (ref <200)
HDL: 45 mg/dL (ref >40)
LDL Cholesterol: 63 mg/dL (ref <100)
Triglycerides: 86 mg/dL (ref <150)
VLDL: 17 mg/dL (ref <30)

## 2016-04-17 LAB — HEMOGLOBIN A1C
Hgb A1c MFr Bld: 5.9 % — ABNORMAL HIGH (ref <5.7)
MEAN PLASMA GLUCOSE: 123 mg/dL

## 2016-04-17 LAB — TSH: TSH: 1.77 mIU/L (ref 0.40–4.50)

## 2016-04-17 LAB — MAGNESIUM: Magnesium: 1.9 mg/dL (ref 1.5–2.5)

## 2016-04-17 MED ORDER — DIVALPROEX SODIUM ER 500 MG PO TB24: ORAL_TABLET | ORAL | 2 refills | Status: AC

## 2016-04-17 NOTE — Patient Instructions (Addendum)
Start Depakote 500 mg - - 1 capsule at bedtime for 1st week,   then increase to 2 capsules at Bedtime   ++++++++++++++++++++++++  Keep Keppra same for now til return in 2 weeks  ++++++++++++++++++++++++ Recommend Adult Low Dose Aspirin or  coated  Aspirin 81 mg daily  To reduce risk of Colon Cancer 20 %,  Skin Cancer 26 % ,  Melanoma 46%  and  Pancreatic cancer 60% +++++++++++++++++++++++++ Vitamin D goal  is between 70-100.  Please make sure that you are taking your Vitamin D as directed.  It is very important as a natural anti-inflammatory  helping hair, skin, and nails, as well as reducing stroke and heart attack risk.  It helps your bones and helps with mood. It also decreases numerous cancer risks so please take it as directed.  Low Vit D is associated with a 200-300% higher risk for CANCER  and 200-300% higher risk for HEART   ATTACK  &  STROKE.   .....................................Marland Kitchen It is also associated with higher death rate at younger ages,  autoimmune diseases like Rheumatoid arthritis, Lupus, Multiple Sclerosis.    Also many other serious conditions, like depression, Alzheimer's Dementia, infertility, muscle aches, fatigue, fibromyalgia - just to name a few. ++++++++++++++++++++ Recommend the book "The END of DIETING" by Dr Excell Seltzer  & the book "The END of DIABETES " by Dr Excell Seltzer At [email or web address] - get book & Audio CD's    Being diabetic has a  300% increased risk for heart attack, stroke, cancer, and alzheimer- type vascular dementia. It is very important that you work harder with diet by avoiding all foods that are white. Avoid white rice (brown & wild rice is OK), white potatoes (sweetpotatoes in moderation is OK), White bread or wheat bread or anything made out of white flour like bagels, donuts, rolls, buns, biscuits, cakes, pastries, cookies, pizza crust, and pasta (made from white flour & egg whites) - vegetarian pasta or spinach or wheat pasta is OK.  Multigrain breads like Arnold's or Pepperidge Farm, or multigrain sandwich thins or flatbreads.  Diet, exercise and weight loss can reverse and cure diabetes in the early stages.  Diet, exercise and weight loss is very important in the control and prevention of complications of diabetes which affects every system in your body, ie. Brain - dementia/stroke, eyes - glaucoma/blindness, heart - heart attack/heart failure, kidneys - dialysis, stomach - gastric paralysis, intestines - malabsorption, nerves - severe painful neuritis, circulation - gangrene & loss of a leg(s), and finally cancer and Alzheimers.    I recommend avoid fried & greasy foods,  sweets/candy, white rice (brown or wild rice or Quinoa is OK), white potatoes (sweet potatoes are OK) - anything made from white flour - bagels, doughnuts, rolls, buns, biscuits,white and wheat breads, pizza crust and traditional pasta made of white flour & egg white(vegetarian pasta or spinach or wheat pasta is OK).  Multi-grain bread is OK - like multi-grain flat bread or sandwich thins. Avoid alcohol in excess. Exercise is also important.    Eat all the vegetables you want - avoid meat, especially red meat and dairy - especially cheese.  Cheese is the most concentrated form of trans-fats which is the worst thing to clog up our arteries. Veggie cheese is OK which can be found in the fresh produce section at [employer] or [employer] or [employer]  +++++++++++++++++++++ DASH Eating Plan  DASH stands for "Dietary Approaches to Stop Hypertension."   The DASH eating plan  is a healthy eating plan that has been shown to reduce high blood pressure (hypertension). Additional health benefits may include reducing the risk of type 2 diabetes mellitus, heart disease, and stroke. The DASH eating plan may also help with weight loss. WHAT DO I NEED TO KNOW ABOUT THE DASH EATING PLAN? For the DASH eating plan, you will follow these general guidelines:  Choose foods with a  percent daily value for sodium of less than 5% (as listed on the food label).  Use salt-free seasonings or herbs instead of table salt or sea salt.  Check with your health care provider or pharmacist before using salt substitutes.  Eat lower-sodium products, often labeled as "lower sodium" or "no salt added."  Eat fresh foods.  Eat more vegetables, fruits, and low-fat dairy products.  Choose whole grains. Look for the word "whole" as the first word in the ingredient list.  Choose fish   Limit sweets, desserts, sugars, and sugary drinks.  Choose heart-healthy fats.  Eat veggie cheese   Eat more home-cooked food and less restaurant, buffet, and fast food.  Limit fried foods.  Cook foods using methods other than frying.  Limit canned vegetables. If you do use them, rinse them well to decrease the sodium.  When eating at a restaurant, ask that your food be prepared with less salt, or no salt if possible.                      WHAT FOODS CAN I EAT? Read Dr Fara Olden Fuhrman's books on The End of Dieting & The End of Diabetes  Grains Whole grain or whole wheat bread. Brown rice. Whole grain or whole wheat pasta. Quinoa, bulgur, and whole grain cereals. Low-sodium cereals. Corn or whole wheat flour tortillas. Whole grain cornbread. Whole grain crackers. Low-sodium crackers.  Vegetables Fresh or frozen vegetables (raw, steamed, roasted, or grilled). Low-sodium or reduced-sodium tomato and vegetable juices. Low-sodium or reduced-sodium tomato sauce and paste. Low-sodium or reduced-sodium canned vegetables.   Fruits All fresh, canned (in natural juice), or frozen fruits.  Protein Products  All fish and seafood.  Dried beans, peas, or lentils. Unsalted nuts and seeds. Unsalted canned beans.  Dairy Low-fat dairy products, such as skim or 1% milk, 2% or reduced-fat cheeses, low-fat ricotta or cottage cheese, or plain low-fat yogurt. Low-sodium or reduced-sodium cheeses.  Fats and  Oils Tub margarines without trans fats. Light or reduced-fat mayonnaise and salad dressings (reduced sodium). Avocado. Safflower, olive, or canola oils. Natural peanut or almond butter.  Other Unsalted popcorn and pretzels. The items listed above may not be a complete list of recommended foods or beverages. Contact your dietitian for more options.  +++++++++++++++  WHAT FOODS ARE NOT RECOMMENDED? Grains/ White flour or wheat flour White bread. White pasta. White rice. Refined cornbread. Bagels and croissants. Crackers that contain trans fat.  Vegetables  Creamed or fried vegetables. Vegetables in a . Regular canned vegetables. Regular canned tomato sauce and paste. Regular tomato and vegetable juices.  Fruits Dried fruits. Canned fruit in light or heavy syrup. Fruit juice.  Meat and Other Protein Products Meat in general - RED meat & White meat.  Fatty cuts of meat. Ribs, chicken wings, all processed meats as bacon, sausage, bologna, salami, fatback, hot dogs, bratwurst and packaged luncheon meats.  Dairy Whole or 2% milk, cream, half-and-half, and cream cheese. Whole-fat or sweetened yogurt. Full-fat cheeses or blue cheese. Non-dairy creamers and whipped toppings. Processed cheese, cheese spreads, or cheese  curds.  Condiments Onion and garlic salt, seasoned salt, table salt, and sea salt. Canned and packaged gravies. Worcestershire sauce. Tartar sauce. Barbecue sauce. Teriyaki sauce. Soy sauce, including reduced sodium. Steak sauce. Fish sauce. Oyster sauce. Cocktail sauce. Horseradish. Ketchup and mustard. Meat flavorings and tenderizers. Bouillon cubes. Hot sauce. Tabasco sauce. Marinades. Taco seasonings. Relishes.  Fats and Oils Butter, stick margarine, lard, shortening and bacon fat. Coconut, palm kernel, or palm oils. Regular salad dressings.  Pickles and olives. Salted popcorn and pretzels.  The items listed above may not be a complete list of foods and beverages to  avoid.

## 2016-04-17 NOTE — Progress Notes (Signed)
[facility] Unk Pinto, M.D.        Uvaldo Bristle. Silverio Lay, P.A.-C       Starlyn Skeans, P.A.-C  [facility]                [address] [place] [id number] Telephone [phone] Telefax [phone] ______________________________________________________________________     This very nice 71 y.o. MWM presents for 3  follow up with Hypertension, ASHD/Afib, ASCVD,  Hyperlipidemia, Pre-Diabetes and Vitamin D Deficiency. In 2013 he had a focal seizure.      Patient has been reported to have some mood lability and agressiveness which seemed to correlate with his beginning [place] for seizures.      Patient is treated for HTN (1997) & BP has been controlled at home. Today's BP is at goal - 120/72. In 2012 he had a non focal CVA. In 2013 he had a negative Nuclear cardiac stress test. In 2015, he was started on Xarelto after presenting with rapid Afib. Patient has had no complaints of any cardiac type chest pain, palpitations, dyspnea/orthopnea/PND, dizziness, claudication, or dependent edema.     Hyperlipidemia is controlled with diet & meds. Patient denies myalgias or other med SE's. Last Lipids were at goal: Lab Results  Component Value Date   CHOL 122 (L) 12/01/2015   HDL 45 12/01/2015   LDLCALC 61 12/01/2015   TRIG 80 12/01/2015   CHOLHDL 2.7 12/01/2015      Also, the patient has history of Severe Morbid Obesity (BMI 42+) and consequent PreDiabetes (A1c 6.0% in 2011)  and has had no symptoms of reactive hypoglycemia, diabetic polys, paresthesias or visual blurring.  Patient confesses his poor dietary choices and gluttonous over eating behaviors, but puts the blame on his wife. His last A1c was not at goal: Lab Results  Component Value Date   HGBA1C 5.9 (H) 12/01/2015      Further, the patient also has history of Vitamin D Deficiency and supplements vitamin D without any suspected side-effects.  Last vitamin D was   Lab Results  Component Value Date   VD25OH 73 02/22/2015   Current Outpatient Prescriptions on File Prior to Visit  Medication Sig  . atorvastatin (LIPITOR) 80 MG tablet Take 1 tablet (80 mg total) by mouth daily.  . budesonide-formoterol (SYMBICORT) 160-4.5 MCG/ACT inhaler Inhale 2 puffs into the lungs 2 (two) times daily.  . Cholecalciferol (VITAMIN D PO) Take 2,000 Units by mouth 2 (two) times daily.  Marland Kitchen doxazosin (CARDURA) 8 MG tablet Take 1 tablet (8 mg total) by mouth daily.  Marland Kitchen levETIRAcetam (KEPPRA) 500 MG tablet TAKE ONE TABLET BY MOUTH TWICE DAILY FOR 10 day supply  . metoprolol succinate (TOPROL-XL) 25 MG 24 hr tablet Take 1 tablet (25 mg total) by mouth 2 (two) times daily.  . montelukast (SINGULAIR) 10 MG tablet Take 1 tablet (10 mg total) by mouth daily.  . Multiple Vitamin (MULTIVITAMIN) tablet Take 1 tablet by mouth daily.  Marland Kitchen neomycin-polymyxin b-dexamethasone (MAXITROL) 3.5-10000-0.1 SUSP Place 1 drop into both eyes 4 (four) times daily.  . phentermine (ADIPEX-P) 37.5 MG tablet Take 1 tablet (37.5 mg total) by mouth daily before breakfast.  . rivaroxaban (XARELTO) 20 MG TABS tablet Take 20 mg by mouth.   . sertraline (ZOLOFT) 100 MG tablet Take 2 tablets (200 mg total) by mouth daily.  . tamsulosin (FLOMAX) 0.4 MG CAPS capsule  TAKE ONE CAPSULE BY MOUTH ONCE DAILY AFTER  SUPPER   No current facility-administered medications on file prior to visit.    No Known Allergies PMHx:   Past Medical History:  Diagnosis Date  . COPD (chronic obstructive pulmonary disease) ([place])   . Depression   . Hyperlipidemia   . Hypertension   . Stroke [facility])    Immunization History  Administered Date(s) Administered  . Influenza Whole 01/19/2011, 01/22/2012  . Influenza, High Dose Seasonal PF 12/01/2015  . Influenza-Unspecified 12/28/2012, 12/10/2013  . Pneumococcal Conjugate-13 10/10/2013  . Pneumococcal-Unspecified 03/12/2009  . Td 03/12/2006   Past Surgical  History:  Procedure Laterality Date  . ANKLE SURGERY    . CAROTID ENDARTERECTOMY     06/2010   FHx:    Reviewed / unchanged  SHx:    Reviewed / unchanged  Systems Review:  Constitutional: Denies fever, chills, wt changes, headaches, insomnia, fatigue, night sweats, change in appetite. Eyes: Denies redness, blurred vision, diplopia, discharge, itchy, watery eyes.  ENT: Denies discharge, congestion, post nasal drip, epistaxis, sore throat, earache, hearing loss, dental pain, tinnitus, vertigo, sinus pain, snoring.  CV: Denies chest pain, palpitations, irregular heartbeat, syncope, dyspnea, diaphoresis, orthopnea, PND, claudication or edema. Respiratory: denies cough, dyspnea, DOE, pleurisy, hoarseness, laryngitis, wheezing.  Gastrointestinal: Denies dysphagia, odynophagia, heartburn, reflux, water brash, abdominal pain or cramps, nausea, vomiting, bloating, diarrhea, constipation, hematemesis, melena, hematochezia  or hemorrhoids. Genitourinary: Denies dysuria, frequency, urgency, nocturia, hesitancy, discharge, hematuria or flank pain. Musculoskeletal: Denies arthralgias, myalgias, stiffness, jt. swelling, pain, limping or strain/sprain.  Skin: Denies pruritus, rash, hives, warts, acne, eczema or change in skin lesion(s). Neuro: No weakness, tremor, incoordination, spasms, paresthesia or pain. Psychiatric: Denies confusion, memory loss or sensory loss. Endo: Denies change in weight, skin or hair change.  Heme/Lymph: No excessive bleeding, bruising or enlarged lymph nodes.  Physical Exam  BP 120/72   Pulse 72   Temp 97.3 F (36.3 C)   Resp 16   Ht 5' 9.75" (1.772 m)   Wt 291 lb (132 kg)   BMI 42.05 kg/m   Appears Over nourished and in no distress.  Eyes: PERRLA, EOMs, conjunctiva no swelling or erythema. Sinuses: No frontal/maxillary tenderness ENT/Mouth: EAC's clear, TM's nl w/o erythema, bulging. Nares clear w/o erythema, swelling, exudates. Oropharynx clear without erythema  or exudates. Oral hygiene is good. Tongue normal, non obstructing. Hearing intact.  Neck: Supple. Thyroid nl. Car 2+/2+ without bruits, nodes or JVD. Chest: Respirations nl with BS clear & equal w/o rales, rhonchi, wheezing or stridor.  Cor: Heart sounds normal w/ regular rate and rhythm without sig. murmurs, gallops, clicks, or rubs. Peripheral pulses normal and equal  without edema.  Abdomen: Soft, rotund  & bowel sounds normal. Non-tender w/o guarding, rebound, hernias, masses, or organomegaly.  Lymphatics: Unremarkable.  Musculoskeletal: Full ROM all peripheral extremities, joint stability, 5/5 strength, and normal gait.  Skin: Warm, dry without exposed rashes, lesions or ecchymosis apparent.  Neuro: Cranial nerves intact, reflexes equal bilaterally. Sensory-motor testing grossly intact. Tendon reflexes grossly intact.  Pysch: Alert & oriented x 3.  Insight and judgement nl & appropriate. No ideations.  Assessment and Plan:  1. Essential hypertension  - Continue medication, monitor blood pressure at home.  - Continue DASH diet. Reminder to go to the ER if any CP,  SOB, nausea, dizziness, severe HA, changes vision/speech,  left arm numbness and tingling and jaw pain.  - CBC with Differential/Platelet - BASIC METABOLIC PANEL WITH GFR - TSH  2. Hyperlipidemia -  Continue diet/meds, exercise,& lifestyle modifications.  - Continue monitor periodic cholesterol/liver & renal functions   - Hepatic function panel - Lipid panel - TSH  3. Prediabetes  - Continue diet, exercise, lifestyle modifications.  - Monitor appropriate labs.  - Hemoglobin A1c - Insulin, random  4. Vitamin D deficiency  - Continue supplementation.  - VITAMIN D 25 Hydroxy   5. Generalized convulsive epilepsy ([place])  - Will switch from Keppra to  - divalproex (DEPAKOTE ER) 500 MG 24 hr tablet; Take 1 tablet 2 x / day to prevent seizures  Dispense: 60 tablet; Refill: 2 And f/u in 2 weeks anticipating d/c  of Keppra  6. Medication management - CBC with Differential/Platelet - BASIC METABOLIC PANEL WITH GFR - Hepatic function panel - Magnesium - Lipid panel - TSH - Hemoglobin A1c - Insulin, random - VITAMIN D 25 Hydroxy        Recommended regular exercise, BP monitoring, weight control, and discussed med and SE's. Recommended labs to assess and monitor clinical status. Further disposition pending results of labs. Over 30 minutes of exam, counseling, chart review was performed

## 2016-04-18 LAB — VITAMIN D 25 HYDROXY (VIT D DEFICIENCY, FRACTURES): VIT D 25 HYDROXY: 44 ng/mL (ref 30–100)

## 2016-04-18 LAB — INSULIN, RANDOM: Insulin: 17.1 u[IU]/mL (ref 2.0–19.6)

## 2016-04-30 ENCOUNTER — Ambulatory Visit: Payer: Medicare Other | Admitting: Internal Medicine

## 2016-04-30 ENCOUNTER — Encounter: Payer: Self-pay | Admitting: Internal Medicine

## 2016-04-30 VITALS — BP 126/84 | HR 64 | Temp 97.3°F | Resp 16 | Ht 69.75 in | Wt 294.6 lb

## 2016-04-30 DIAGNOSIS — G40309 Generalized idiopathic epilepsy and epileptic syndromes, not intractable, without status epilepticus: Secondary | ICD-10-CM

## 2016-04-30 NOTE — Progress Notes (Signed)
[facility]   Unk Pinto, M.D.    Uvaldo Bristle. Silverio Lay, P.A.-C      Starlyn Skeans, P.A.-C  [facility]                [address] [place], [place] [id number] Telephone [phone] Telefax [phone]  Subjective:    Patient ID: Chanson Aschoff, male    DOB: [birth year], 71 y.o.   MRN: [id number]  HPI  Patient returned for 2 week f/u of transition from [place] to Depakote for concern over personality changes speculated due possibly to the [place].   Apparently he purchased the Depakote ER 500 bid, but did not start for concern re: $ 150 co-pay from [facility] ([facility] lists total cost of $ 46).   Review of Systems    Objective:   Physical Exam  BP 126/84   Pulse 64   Temp 97.3 F (36.3 C)   Resp 16   Ht 5' 9.75" (1.772 m)   Wt 294 lb 9.6 oz (133.6 kg)   BMI 42.57 kg/m   No formal exam and patient appears in no distress    Assessment & Plan:    1. Generalized convulsive epilepsy ([place])   - patient was advised to start the meds he previously purchased at the previously recommended schedule of 1 tab qhs 1st week, then increase to 1 tab bid and given a Rx for Depakote 500 mg #180 x 1 Rf to take to the [place] clinics, where he indicated he receives meds at no charge.     He's asked to return in 3 weeks to re-evaluate & for Depakote level if he's taking his meds as recommended .    Patient's compliance due to poor judgement attributed to worsening dementia  discussed with daughter-in-law.  (No charge OV today)

## 2016-05-17 ENCOUNTER — Encounter: Payer: Self-pay | Admitting: Physician Assistant

## 2016-05-24 ENCOUNTER — Ambulatory Visit: Payer: Self-pay | Admitting: Internal Medicine

## 2016-06-07 ENCOUNTER — Ambulatory Visit (INDEPENDENT_AMBULATORY_CARE_PROVIDER_SITE_OTHER): Payer: Medicare Other | Admitting: Internal Medicine

## 2016-06-07 VITALS — BP 126/86 | HR 72 | Temp 97.5°F | Resp 16 | Ht 69.75 in | Wt 292.8 lb

## 2016-06-07 DIAGNOSIS — Z79899 Other long term (current) drug therapy: Secondary | ICD-10-CM | POA: Diagnosis not present

## 2016-06-07 DIAGNOSIS — G40309 Generalized idiopathic epilepsy and epileptic syndromes, not intractable, without status epilepticus: Secondary | ICD-10-CM

## 2016-06-07 DIAGNOSIS — I10 Essential (primary) hypertension: Secondary | ICD-10-CM | POA: Diagnosis not present

## 2016-06-07 LAB — BASIC METABOLIC PANEL WITH GFR
BUN: 19 mg/dL (ref 7–25)
CHLORIDE: 103 mmol/L (ref 98–110)
CO2: 25 mmol/L (ref 20–31)
CREATININE: 0.91 mg/dL (ref 0.70–1.18)
Calcium: 9.1 mg/dL (ref 8.6–10.3)
GFR, Est African American: >89 mL/min (ref >=60)
GFR, Est Non African American: 84 mL/min (ref >=60)
Glucose, Bld: 99 mg/dL (ref 65–99)
Potassium: 4.8 mmol/L (ref 3.5–5.3)
Sodium: 141 mmol/L (ref 135–146)

## 2016-06-08 ENCOUNTER — Encounter: Payer: Self-pay | Admitting: Internal Medicine

## 2016-06-08 LAB — VALPROIC ACID LEVEL: Valproic Acid Lvl: 67.2 ug/mL (ref 50.0–100.0)

## 2016-06-08 NOTE — Progress Notes (Signed)
  Subjective:    Patient ID: George Delgado, male    DOB: November 18, 1945, 71 y.o.   MRN: [id number]  HPI   This very nice 71 y.o. MWM  with hx/o multiple comorbidities including post stroke seizure disorder who presents for 1 month  follow up transitioning from [place] to Depakote and for monitoring blood levels. Patient apparently doing well, has had no aura or break-thru seizures and admits affect is more calm and relaxed and less irritable than when on Keppra.   Medication Sig  . atorvastatin80 MG tablet Take 1 tablet (80 mg total) by mouth daily.  . SYMBICORT 160-4.5 Inhale 2 puffs into the lungs 2 (two) times daily.  Marland Kitchen VITAMIN D Take 2,000 Units by mouth 2 (two) times daily.  Marland Kitchen DEPAKOTE ER)500 MG 24 hr Take 1 tablet 2 x / day to prevent seizures  . doxazosin  8 MG Take 1 tablet (8 mg total) by mouth daily.  . metoprolol succ-XL 25 MG  Take 1 tablet (25 mg total) by mouth 2 (two) times daily.  . montelukast 10 MG  Take 1 tablet (10 mg total) by mouth daily.  . Multiple Vitamin  Take 1 tablet by mouth daily.  Marland Kitchen MAXITROL  SUSP Place 1 drop into both eyes 4 (four) times daily.  . phentermine  37.5 MG tablet Take 1 tablet (37.5 mg total) by mouth daily before breakfast.  . XARELTO 20 MG TABS tablet Take 20 mg by mouth.   . sertraline  100 MG tablet Take 2 tablets (200 mg total) by mouth daily.  . tamsulosin  0.4 MG  TAKE ONE CAPSULE BY MOUTH ONCE DAILY AFTER  SUPPER   No Known Allergies   Past Medical History:  Diagnosis Date  . COPD (chronic obstructive pulmonary disease) ([place])   . Depression   . Hyperlipidemia   . Hypertension   . Stroke [facility])    Past Surgical History:  Procedure Laterality Date  . ANKLE SURGERY    . CAROTID ENDARTERECTOMY     06/2010   Review of Systems  10 point systems review negative except as above.    Objective:   Physical Exam  BP 126/86   Pulse 72   Temp 97.5 F (36.4 C)   Resp 16   Ht 5' 9.75" (1.772 m)   Wt 292 lb 12.8 oz (132.8 kg)   BMI 42.31  kg/m   HEENT - WNL Neck - supple. Nl Thyroid. Carotids 2+ & No bruits, nodes, JVD Chest - Clear equal BS. Cor - Nl HS. RRR w/o sig MGR. PP 1(+). No edema. Abd - Obese. MS- FROM w/o deformities. Muscle power, tone and bulk Nl. Gait Nl. Neuro - No obvious Cr N abnormalities. Nl w/o focal abnormalities. Psyche - Mental status pleasant, normal & appropriate.  No delusions, ideations or obvious mood abnormalities. Skin - Exposed is unremarkable.    Assessment & Plan:   1. Generalized convulsive epilepsy ([place])  - Valproic acid level  2. Essential hypertension   3. Medication management  - Valproic acid level - BASIC METABOLIC PANEL WITH GFR  - discussed meds & SE's.

## 2016-10-12 ENCOUNTER — Ambulatory Visit: Payer: Self-pay | Admitting: Internal Medicine

## 2016-11-07 ENCOUNTER — Encounter (HOSPITAL_COMMUNITY): Payer: Self-pay | Admitting: Neurology

## 2016-11-07 ENCOUNTER — Other Ambulatory Visit: Payer: Self-pay | Admitting: Emergency Medicine

## 2016-11-07 ENCOUNTER — Emergency Department (HOSPITAL_COMMUNITY): Payer: Medicare Other

## 2016-11-07 ENCOUNTER — Emergency Department (HOSPITAL_COMMUNITY)
Admission: EM | Admit: 2016-11-07 | Discharge: 2016-11-07 | Disposition: A | Discharge status: Home / Self Care | Payer: Medicare Other | Attending: Emergency Medicine | Admitting: Emergency Medicine

## 2016-11-07 DIAGNOSIS — I480 Paroxysmal atrial fibrillation: Secondary | ICD-10-CM | POA: Diagnosis not present

## 2016-11-07 DIAGNOSIS — I10 Essential (primary) hypertension: Secondary | ICD-10-CM | POA: Insufficient documentation

## 2016-11-07 DIAGNOSIS — Z8673 Personal history of transient ischemic attack (TIA), and cerebral infarction without residual deficits: Secondary | ICD-10-CM | POA: Insufficient documentation

## 2016-11-07 DIAGNOSIS — R0789 Other chest pain: Secondary | ICD-10-CM | POA: Diagnosis not present

## 2016-11-07 DIAGNOSIS — E785 Hyperlipidemia, unspecified: Secondary | ICD-10-CM | POA: Insufficient documentation

## 2016-11-07 DIAGNOSIS — R079 Chest pain, unspecified: Secondary | ICD-10-CM | POA: Diagnosis not present

## 2016-11-07 DIAGNOSIS — J449 Chronic obstructive pulmonary disease, unspecified: Secondary | ICD-10-CM | POA: Insufficient documentation

## 2016-11-07 DIAGNOSIS — Z87891 Personal history of nicotine dependence: Secondary | ICD-10-CM | POA: Insufficient documentation

## 2016-11-07 DIAGNOSIS — Z79899 Other long term (current) drug therapy: Secondary | ICD-10-CM | POA: Insufficient documentation

## 2016-11-07 DIAGNOSIS — R0602 Shortness of breath: Secondary | ICD-10-CM | POA: Insufficient documentation

## 2016-11-07 HISTORY — DX: Unspecified atrial fibrillation: I48.91

## 2016-11-07 LAB — CBC WITH DIFFERENTIAL/PLATELET
BASOS ABS: 0.1 10*3/uL (ref 0.0–0.1)
Basophils Relative: 1 %
EOS ABS: 0.2 10*3/uL (ref 0.0–0.7)
EOS PCT: 3 %
HCT: 43.6 % (ref 39.0–52.0)
Hemoglobin: 14.6 g/dL (ref 13.0–17.0)
LYMPHS ABS: 1.3 10*3/uL (ref 0.7–4.0)
LYMPHS PCT: 23 %
MCH: 30.4 pg (ref 26.0–34.0)
MCHC: 33.5 g/dL (ref 30.0–36.0)
MCV: 90.6 fL (ref 78.0–100.0)
MONO ABS: 0.4 10*3/uL (ref 0.1–1.0)
Monocytes Relative: 7 %
Neutro Abs: 3.7 10*3/uL (ref 1.7–7.7)
Neutrophils Relative %: 66 %
PLATELETS: 253 10*3/uL (ref 150–400)
RBC: 4.81 MIL/uL (ref 4.22–5.81)
RDW: 13.8 % (ref 11.5–15.5)
WBC: 5.6 10*3/uL (ref 4.0–10.5)

## 2016-11-07 LAB — PROTIME-INR
INR: 1.17
PROTHROMBIN TIME: 14.8 s (ref 11.4–15.2)

## 2016-11-07 LAB — COMPREHENSIVE METABOLIC PANEL
ALT: 22 U/L (ref 17–63)
AST: 19 U/L (ref 15–41)
Albumin: 3.4 g/dL — ABNORMAL LOW (ref 3.5–5.0)
Alkaline Phosphatase: 44 U/L (ref 38–126)
Anion gap: 7 (ref 5–15)
BUN: 19 mg/dL (ref 6–20)
CHLORIDE: 106 mmol/L (ref 101–111)
CO2: 27 mmol/L (ref 22–32)
Calcium: 8.7 mg/dL — ABNORMAL LOW (ref 8.9–10.3)
Creatinine, Ser: 1.14 mg/dL (ref 0.61–1.24)
Glucose, Bld: 131 mg/dL — ABNORMAL HIGH (ref 65–99)
POTASSIUM: 4.2 mmol/L (ref 3.5–5.1)
SODIUM: 140 mmol/L (ref 135–145)
Total Bilirubin: 0.7 mg/dL (ref 0.3–1.2)
Total Protein: 6.2 g/dL — ABNORMAL LOW (ref 6.5–8.1)

## 2016-11-07 LAB — I-STAT TROPONIN, ED: Troponin i, poc: 0.01 ng/mL (ref 0.00–0.08)

## 2016-11-07 LAB — BRAIN NATRIURETIC PEPTIDE: B Natriuretic Peptide: 113 pg/mL — ABNORMAL HIGH (ref 0.0–100.0)

## 2016-11-07 MED ORDER — SODIUM CHLORIDE 0.9 % IV BOLUS (SEPSIS)
500.0000 mL | Freq: Once | INTRAVENOUS | Status: AC
  Administered 2016-11-07: 500 mL via INTRAVENOUS

## 2016-11-07 MED ORDER — SODIUM CHLORIDE 0.9 % IV BOLUS (SEPSIS): 1000.0000 mL | Freq: Once | INTRAVENOUS | Status: DC

## 2016-11-07 NOTE — ED Provider Notes (Signed)
[facility] DEPT Provider Note   CSN: [id number] Arrival date & time: 11/07/16  1254     History   Chief Complaint Chief Complaint  Patient presents with  . Chest Pain    HPI George Delgado is a 71 y.o. male history of atrial fibrillation on a Route toe, COPD, hypertension, hyperlipidemia here presenting with chest pain, shortness of breath. Patient states that he was awake all night last night due to substernal chest pain and chest pressure. Patient denies any leg swelling or weight gain. Patient was noted to be in rapid A. fib as per EMS that spontaneously resolved. Patient was given 2 nitroglycerin sublingual, 1 inch nitro paste, 324 mg aspirin. Patient was noted to be hypotensive 90s in the ED and nitro paste was removed.    The history is provided by the patient.    Past Medical History:  Diagnosis Date  . A-fib ([place])   . COPD (chronic obstructive pulmonary disease) ([place])   . Depression   . Hyperlipidemia   . Hypertension   . Stroke [facility])     Patient Active Problem List   Diagnosis Date Noted  . Seizures ([place]) 03/14/2016  . Encounter for Medicare annual wellness exam 02/22/2015  . Major depression in partial remission ([place]) 08/18/2014  . Chronic a-fib ([place]) 08/18/2014  . BPH (benign prostatic hyperplasia) 08/18/2014  . Medication management 07/07/2013  . Vitamin D deficiency 04/30/2013  . Morbid obesity (BMI 37.97) 04/30/2013  . Generalized convulsive epilepsy ([place]) 01/29/2013  . OSA on CPAP 01/29/2013  . Essential hypertension 01/28/2013  . ASCVD (arteriosclerotic cardiovascular disease) 01/28/2013  . Hyperlipidemia 01/28/2013  . Prediabetes 01/28/2013    Past Surgical History:  Procedure Laterality Date  . ANKLE SURGERY    . CAROTID ENDARTERECTOMY     06/2010       Home Medications    Prior to Admission medications   Medication Sig Start Date End Date Taking? Authorizing Provider  atorvastatin (LIPITOR) 80 MG tablet Take 1 tablet (80 mg total)  by mouth daily. 07/25/15  Yes Vicie Mutters, PA-C  budesonide-formoterol [facility]) 160-4.5 MCG/ACT inhaler Inhale 2 puffs into the lungs 2 (two) times daily.   Yes [provider]  Cholecalciferol (VITAMIN D PO) Take 2,000 Units by mouth 2 (two) times daily.   Yes [provider]  doxazosin (CARDURA) 8 MG tablet Take 1 tablet (8 mg total) by mouth daily. Patient taking differently: Take 8 mg by mouth at bedtime.  07/25/15  Yes Vicie Mutters, PA-C  metoprolol succinate (TOPROL-XL) 25 MG 24 hr tablet Take 1 tablet (25 mg total) by mouth 2 (two) times daily. 07/25/15  Yes Vicie Mutters, PA-C  Multiple Vitamin (MULTIVITAMIN) tablet Take 1 tablet by mouth daily.   Yes [provider]  nitroGLYCERIN (NITROGLYN) 2 % ointment Apply 1 inch topically once.   Yes [provider]  nitroGLYCERIN (NITROSTAT) 0.4 MG SL tablet Place 0.4 mg under the tongue once.   Yes [provider]  rivaroxaban (XARELTO) 20 MG TABS tablet Take 20 mg by mouth daily with supper.    Yes [provider]  sertraline (ZOLOFT) 100 MG tablet Take 2 tablets (200 mg total) by mouth daily. Patient taking differently: Take 100 mg by mouth daily.  07/25/15  Yes Vicie Mutters, PA-C  vitamin B-12 (CYANOCOBALAMIN) 1000 MCG tablet Take 1,000 mcg by mouth daily.   Yes [provider]  divalproex (DEPAKOTE ER) 500 MG 24 hr tablet Take 1 tablet 2 x / day to prevent  seizures Patient taking differently: Take 500 mg by mouth 2 (two) times daily. to prevent seizures 04/17/16 07/15/16  Unk Pinto, MD  montelukast (SINGULAIR) 10 MG tablet Take 1 tablet (10 mg total) by mouth daily. 07/25/15 07/24/16  Vicie Mutters, PA-C  neomycin-polymyxin b-dexamethasone (MAXITROL) 3.5-10000-0.1 SUSP Place 1 drop into both eyes 4 (four) times daily. 12/01/15   Vicie Mutters, PA-C  phentermine (ADIPEX-P) 37.5 MG tablet Take 1 tablet (37.5 mg total) by mouth daily before breakfast. 12/01/15   Vicie Mutters, PA-C    Family History Family History  Problem Relation Age of Onset  . Ovarian cancer Mother   . Hypertension Mother   . Hypertension Father   . Cancer Father   . Hypertension Brother   . Diabetes Brother   . Diabetes Daughter   . Diabetes Son   . Hypertension Sister     Social History Social History  Substance Use Topics  . Smoking status: Former Smoker    Quit date: 05/11/2010  . Smokeless tobacco: Never Used  . Alcohol use No     Comment: rarely     Allergies   Patient has no known allergies.   Review of Systems Review of Systems  Cardiovascular: Positive for chest pain.  All other systems reviewed and are negative.    Physical Exam Updated Vital Signs BP 103/65   Pulse 70   Temp (!) 97.5 F (36.4 C) (Oral)   Resp 16   Ht 5\' 10"  (1.778 m)   Wt 127 kg (280 lb)   SpO2 95%   BMI 40.18 kg/m   Physical Exam  Constitutional: He is oriented to person, place, and time. He appears well-developed.  HENT:  Head: Normocephalic.  Mouth/Throat: Oropharynx is clear and moist.  Eyes: Pupils are equal, round, and reactive to light. Conjunctivae and EOM are normal.  Neck: Normal range of motion. Neck supple.  Cardiovascular: Normal rate, regular rhythm and normal heart sounds.   Pulmonary/Chest: Effort normal and breath sounds normal. No respiratory distress. He has no wheezes.  Abdominal: Soft. Bowel sounds are normal. He exhibits no distension. There is no tenderness. There is no guarding.  Musculoskeletal: Normal range of motion. He exhibits no edema or deformity.  Neurological: He is alert and oriented to person, place, and time.  Skin: Skin is warm.  Psychiatric: He has a normal mood and affect.  Nursing note and vitals reviewed.    ED Treatments / Results  Labs (all labs ordered are listed, but only abnormal results are displayed) Labs Reviewed  COMPREHENSIVE METABOLIC PANEL - Abnormal; Notable for the following:       Result Value   Glucose,  Bld 131 (*)    Calcium 8.7 (*)    Total Protein 6.2 (*)    Albumin 3.4 (*)    All other components within normal limits  BRAIN NATRIURETIC PEPTIDE - Abnormal; Notable for the following:    B Natriuretic Peptide 113.0 (*)    All other components within normal limits  CBC WITH DIFFERENTIAL/PLATELET  PROTIME-INR  I-STAT TROPONIN, ED    EKG  EKG Interpretation  Date/Time:  Wednesday November 07 2016 13:07:02 EDT Ventricular Rate:  75 PR Interval:    QRS Duration: 79 QT Interval:  377 QTC Calculation: 421 R Axis:   66 Text Interpretation:  Sinus rhythm Low voltage, precordial leads Abnormal R-wave progression, early transition No significant change since last tracing Confirmed by Wandra Arthurs [id number]) on 11/07/2016 1:49:13 PM  Radiology Dg Chest 2 View  Result Date: 11/07/2016 CLINICAL DATA:  Chest pain EXAM: CHEST  2 VIEW COMPARISON:  None. FINDINGS: Normal mediastinum and cardiac silhouette. Normal pulmonary vasculature. No evidence of effusion, infiltrate, or pneumothorax. No acute bony abnormality. Degenerative osteophytosis of the spine. Anterior wedge Compression deformities of the midthoracic spine. IMPRESSION: No active cardiopulmonary disease. Electronically Signed   By: Suzy Bouchard M.D.   On: 11/07/2016 13:53    Procedures Procedures (including critical care time)  Medications Ordered in ED Medications  sodium chloride 0.9 % bolus 500 mL (500 mLs Intravenous New Bag/Given 11/07/16 1420)     Initial Impression / Assessment and Plan / ED Course  I have reviewed the triage vital signs and the nursing notes.  Pertinent labs & imaging results that were available during my care of the patient were reviewed by me and considered in my medical decision making (see chart for details).     Kacper Cartlidge is a 71 y.o. male here with chest pain. Consider symptoms from rapid afib vs ACS. Moderate risk for ACS. Back in sinus rhythm now. Will consult cardiology.   3:50  PM Cardiology saw patient and recommend outpatient follow up with afib clinic.    Final Clinical Impressions(s) / ED Diagnoses   Final diagnoses:  None    New Prescriptions New Prescriptions   No medications on file     Drenda Freeze, MD 11/07/16 [phone]

## 2016-11-07 NOTE — Discharge Instructions (Signed)
Continue take your medicines as prescribed.   See cardiology for follow up   Return to ER if you have worse chest pain, shortness of breath.       You have a Stress Test scheduled at [facility]. Your doctor has ordered this test to check the blood flow in your heart arteries.  Please arrive 15 minutes early for paperwork. The whole test will take several hours. You may want to bring reading material to remain occupied while undergoing different parts of the test.  Instructions:  No food/drink after midnight the night before.  It is OK to take your morning meds with a sip of water EXCEPT for those types of medicines listed below or otherwise instructed.  No caffeine/decaf products 24 hours before, including medicines such as Excedrin or Goody Powders. Call if there are any questions.   Wear comfortable clothes and shoes.   Special Medication Instructions:  Beta blockers such as metoprolol (Lopressor/Toprol XL), atenolol (Tenormin), carvedilol (Coreg), nebivolol (Bystolic), bisoprolol (Zebeta), propranolol (Inderal) should not be taken for 24 hours before the test.  Calcium channel blockers such as diltiazem (Cardizem) or verapmil (Calan) should not be taken for 24 hours before the test.  Remove nitroglycerin patches and do not take nitrate preparations such as Imdur/isosorbide the day of your test.  No Persantine/Theophylline or Aggrenox medicines should be used within 24 hours of the test.   If you are diabetic, please ask which medications to hold the day of the test.  What To Expect: When you arrive in the lab, the technician will inject a small amount of radioactive tracer into your arm through an IV while you are resting quietly. This helps [place] to form pictures of your heart. You will likely only feel a sting from the IV. After a waiting period, resting pictures will be obtained under a big camera. These are the "before" pictures.  Next, you will be  prepped for the stress portion of the test. This may include either walking on a treadmill or receiving a medicine that helps to dilate blood vessels in your heart to simulate the effect of exercise on your heart. If you are walking on a treadmill, you will walk at different paces to try to get your heart rate to a goal number that is based on your age. If your doctor has chosen the pharmacologic test, then you will receive a medicine through your IV that may cause temporary nausea, flushing, shortness of breath and sometimes chest discomfort or vomiting. This is typically short-lived and usually resolves quickly. If you experience symptoms, that does not automatically mean the test is abnormal. Some patients do not experience any symptoms at all. Your blood pressure and heart rate will be monitored, and we will be watching your EKG on a computer screen for any changes. During this portion of the test, the radiologist will inject another small amount of radioactive tracer into your IV. After a waiting period, you will undergo a second set of pictures. These are the "after" pictures.  The doctor reading the test will compare the before-and-after images to look for evidence of heart blockages or heart weakness. The test usually takes 1 day to complete, but in certain instances (for example, if a patient is over a certain weight limit), the test may be done over the span of 2 days.

## 2016-11-07 NOTE — Consult Note (Signed)
Cardiology Consultation:   Patient ID: George Delgado; [id number]; Dec 17, 1945   Admit date: 11/07/2016 Date of Consult: 11/07/2016  Primary Care Provider: Unk Pinto, MD Primary Cardiologist: New, saw the [facility] a few years ago Primary Electrophysiologist:  N/A  Patient Profile:   George Delgado is a 71 y.o. male with a hx of HTN, ASCVD/CVA (2012) and consequent seizure disorder, pAF atrial fibrillation (Xarelto and BB),  h/o postural hypotension, prediabetes, carotid , morbid obesity, OSA-- on CPAP, HLP, Vit Deficiency, BPH who is being seen today for the evaluation of atrial fibrillation and chest pain  at the request of  Dr. Darl Householder .  History of Present Illness:   George Delgado had a negative nuclear stress test in 2013. He was diagnosed with atrial fibrillation in 2015 and started on Xarelto then and endorses being complaint and not missing any dosages of this medication. He follows up with his PCP yearly and also historically use the [place].  He presented to the Emergency Department by EMS from home. He developed atrial fibrillation with subsequent chest pain last night that has continued through today. On EMS arrival his heart rate was in the 150s. He spontaneously converted to sinus rhythm. EMS gave 324 mg Aspirin and 2 SL nitro and nitro past -- his chest pain resolved when his afib stopped. He dropped his systolic BP to the 67E and the nitro paste was removed. He takes Toprol XL 25 mg daily.  No significant metabolic derangements. Albumin is 3.4. BNP 113, Troponin 0.01. CBC wnl, chest xray-- no abnormal cardiopulmonary findings.EKG sinus rhythm rate of 75.   He has not had any episodes of atrial fibrillation in the past few years. He is able to tell when he has the arrhythmia because he develops chest pain. Had a brief episode 2 days ago that lasted 2 hours with the chest pain which resolved on its own.   Past Medical History:  Diagnosis Date  . A-fib ([place])   . COPD (chronic obstructive  pulmonary disease) ([place])   . Depression   . Hyperlipidemia   . Hypertension   . Stroke [facility])     Past Surgical History:  Procedure Laterality Date  . ANKLE SURGERY    . CAROTID ENDARTERECTOMY     06/2010     Inpatient Medications: Scheduled Meds:  Continuous Infusions:  PRN Meds:   Allergies:   No Known Allergies  Social History:   Social History   Social History  . Marital status: Widowed    Spouse name: N/A  . Number of children: N/A  . Years of education: N/A   Occupational History  . Not on file.   Social History Main Topics  . Smoking status: Former Smoker    Quit date: 05/11/2010  . Smokeless tobacco: Never Used  . Alcohol use No     Comment: rarely  . Drug use: No  . Sexual activity: Not on file   Other Topics Concern  . Not on file   Social History Narrative  . No narrative on file    Family History:   Family History  Problem Relation Age of Onset  . Ovarian cancer Mother   . Hypertension Mother   . Hypertension Father   . Cancer Father   . Hypertension Brother   . Diabetes Brother   . Diabetes Daughter   . Diabetes Son   . Hypertension Sister      ROS:  Please see the history of present illness.  ROS  All  other ROS reviewed and negative.     Physical Exam/Data:   Vitals:   11/07/16 1309 11/07/16 1330 11/07/16 1415  BP: 93/63 97/72 102/82  Pulse: 75 73 70  Resp: 14 12 18   Temp: (!) 97.5 F (36.4 C)    TempSrc: Oral    SpO2: 94% 95% 94%  Weight: 280 lb (127 kg)    Height: 5\' 10"  (1.778 m)     No intake or output data in the 24 hours ending 11/07/16 1435 Filed Weights   11/07/16 1309  Weight: 280 lb (127 kg)   Body mass index is 40.18 kg/m.  General:  Well nourished, well developed, in no acute distress, obese white male HEENT: normal Lymph: no adenopathy Neck: no JVD Endocrine:  No thryomegaly Vascular: No carotid bruits; FA pulses 2+ bilaterally without bruits  Cardiac:  normal S1, S2; RRR; no murmur Lungs:  clear  to auscultation bilaterally, no wheezing, rhonchi or rales  Abd: soft, nontender, no hepatomegaly  Ext: no edema Musculoskeletal:  No deformities, BUE and BLE strength normal and equal Skin: warm and dry  Neuro:  CNs 2-12 intact, no focal abnormalities noted Psych:  Normal affect   EKG:  The EKG was personally reviewed and demonstrates:  Sinus rhythm, HR 75 Telemetry:  Telemetry was personally reviewed and demonstrates:   Sinus rhythm  Relevant CV Studies:   Nuclear stress test 08/01/2013: Normal stress nuclear study.   Laboratory Data:  Chemistry Recent Labs Lab 11/07/16 1317  NA 140  K 4.2  CL 106  CO2 27  GLUCOSE 131*  BUN 19  CREATININE 1.14  CALCIUM 8.7*  GFRNONAA >60  GFRAA >60  ANIONGAP 7     Recent Labs Lab 11/07/16 1317  PROT 6.2*  ALBUMIN 3.4*  AST 19  ALT 22  ALKPHOS 44  BILITOT 0.7   Hematology Recent Labs Lab 11/07/16 1317  WBC 5.6  RBC 4.81  HGB 14.6  HCT 43.6  MCV 90.6  MCH 30.4  MCHC 33.5  RDW 13.8  PLT 253    Recent Labs Lab 11/07/16 1325  TROPIPOC 0.01    Radiology/Studies:  Dg Chest 2 View  Result Date: 11/07/2016 CLINICAL DATA:  Chest pain EXAM: CHEST  2 VIEW COMPARISON:  None. FINDINGS: Normal mediastinum and cardiac silhouette. Normal pulmonary vasculature. No evidence of effusion, infiltrate, or pneumothorax. No acute bony abnormality. Degenerative osteophytosis of the spine. Anterior wedge Compression deformities of the midthoracic spine. IMPRESSION: No active cardiopulmonary disease. Electronically Signed   By: Suzy Bouchard M.D.   On: 11/07/2016 13:53    Assessment and Plan:   1. Atrial fibrillation with RVR: No rhythm strip in Epic that shows afib with RVR, EKG and on Tele shows normal sinus rhythm.  He has hx of atrial fibrillation and is compliant with his Xarelto. He takes Metoprolol 25 mg XL daily. He is currently pain free, neg Troponin. Normal EKG. -- His BP is borderline low 103/65.  --  He would like to  follow-up with his PCP and see if she is willing to manage his afib, if not he will try the [place] or our office. -- Will order outpatient stress test and follow-up with the atrial fibrillation clinic. If he has a negative stress test the Afib clinic can consider 'pill in pocket' Flecainide.   2. Hypertension: BP is low normal. Continue home regimen.   Kristopher Glee, PA-C  11/07/2016   Patient examined chart reviewed. Discussed care with patient, wife and PA. History  of PAF, stroke On xarelto with no missed doses. Converted in ER PAF quiescent last 2 years until this week. ECG normal History of stroke with left CEA. Had duplex past month at [place] and ok. However given known vascular disease and SSCP with afib needs a f/u stress test. Last one was about 5 years ago. Exam with obese white male. Clear lungs no murmur left CEA no edema. Will arrange outpatient exercise myovue and f/u in afib clinic. If myovue normal can consider script for pill in pocket flecainide 300 mg.   Jenkins Rouge

## 2016-11-07 NOTE — ED Triage Notes (Signed)
Per ems- pt comes from home c/o cp since last night, central CP, continued into today. History of AFIB, EMS arrived today afib HR up to 150, cp. While in the truck he converted to [place]. Given 2 SL nitro, 1 inch nitro paste, CP resolved. 324 aspirin given PTA. Is a x 4, no pain, nitro paste removed due to BP in 11'H systolic .

## 2016-11-09 ENCOUNTER — Telehealth (HOSPITAL_COMMUNITY): Payer: Self-pay | Admitting: *Deleted

## 2016-11-09 NOTE — Telephone Encounter (Signed)
Left message to schedule follow up appointment.

## 2016-11-09 NOTE — Telephone Encounter (Signed)
-----   Message from Delos Haring, [place] sent at 11/07/2016  4:08 PM EDT ----- Regarding: Afib Clinc/Exercise Stress Hello- I would like to arrange George Delgado to have a Nuclear Exercise Stress test done as an outpatient, he weighs 280 lbs.  I would then like to arrange for him to follow-up with the Afib Clinic to see Butch Penny, NP approx one week after the stress test-- per Dr. Johnsie Cancel.  Thank you all so much for your help!  Delos Haring

## 2016-11-13 ENCOUNTER — Telehealth (HOSPITAL_COMMUNITY): Payer: Self-pay | Admitting: *Deleted

## 2016-11-13 NOTE — Telephone Encounter (Signed)
Patient given detailed instructions per Myocardial Perfusion Study Information Sheet for the test on 11/14/16 at 1245. Patient notified to arrive 15 minutes early and that it is imperative to arrive on time for appointment to keep from having the test rescheduled.  If you need to cancel or reschedule your appointment, please call the office within 24 hours of your appointment. . Patient verbalized understanding.Chip Canepa, [place]

## 2016-11-14 ENCOUNTER — Ambulatory Visit (HOSPITAL_COMMUNITY): Payer: Medicare Other | Attending: Cardiology

## 2016-11-14 DIAGNOSIS — I480 Paroxysmal atrial fibrillation: Secondary | ICD-10-CM | POA: Insufficient documentation

## 2016-11-14 MED ORDER — TECHNETIUM TC 99M TETROFOSMIN IV KIT
31.5000 | PACK | Freq: Once | INTRAVENOUS | Status: AC | PRN
  Administered 2016-11-14: 31.5 via INTRAVENOUS
  Filled 2016-11-14: qty 32

## 2016-11-14 MED ORDER — REGADENOSON 0.4 MG/5ML IV SOLN
0.4000 mg | Freq: Once | INTRAVENOUS | Status: AC
Start: 2016-11-14 — End: 2016-11-14
  Administered 2016-11-14: 0.4 mg via INTRAVENOUS

## 2016-11-15 ENCOUNTER — Ambulatory Visit (HOSPITAL_COMMUNITY): Payer: Medicare Other | Attending: Cardiovascular Disease

## 2016-11-15 LAB — MYOCARDIAL PERFUSION IMAGING
CSEPPHR: 93 {beats}/min
LHR: 0.35
LV dias vol: 117 mL (ref 62–150)
LV sys vol: 51 mL
NUC STRESS TID: 1
Rest HR: 77 {beats}/min
SDS: 0
SRS: 0
SSS: 0

## 2016-11-15 MED ORDER — TECHNETIUM TC 99M TETROFOSMIN IV KIT
32.6000 | PACK | Freq: Once | INTRAVENOUS | Status: AC | PRN
  Administered 2016-11-15: 32.6 via INTRAVENOUS
  Filled 2016-11-15: qty 33

## 2016-11-22 ENCOUNTER — Inpatient Hospital Stay (HOSPITAL_COMMUNITY): Admission: RE | Admit: 2016-11-22 | Payer: Medicare Other | Source: Ambulatory Visit | Admitting: Nurse Practitioner

## 2016-11-23 ENCOUNTER — Ambulatory Visit (HOSPITAL_COMMUNITY)
Admission: RE | Admit: 2016-11-23 | Discharge: 2016-11-23 | Disposition: A | Discharge status: Home / Self Care | Payer: Medicare Other | Source: Ambulatory Visit | Attending: Nurse Practitioner | Admitting: Nurse Practitioner

## 2016-11-23 VITALS — BP 106/64 | HR 74 | Ht 70.0 in | Wt 297.2 lb

## 2016-11-23 DIAGNOSIS — Z79899 Other long term (current) drug therapy: Secondary | ICD-10-CM | POA: Insufficient documentation

## 2016-11-23 DIAGNOSIS — Z7901 Long term (current) use of anticoagulants: Secondary | ICD-10-CM | POA: Insufficient documentation

## 2016-11-23 DIAGNOSIS — I480 Paroxysmal atrial fibrillation: Secondary | ICD-10-CM

## 2016-11-23 DIAGNOSIS — Z833 Family history of diabetes mellitus: Secondary | ICD-10-CM | POA: Insufficient documentation

## 2016-11-23 DIAGNOSIS — I482 Chronic atrial fibrillation, unspecified: Secondary | ICD-10-CM

## 2016-11-23 DIAGNOSIS — F329 Major depressive disorder, single episode, unspecified: Secondary | ICD-10-CM | POA: Insufficient documentation

## 2016-11-23 DIAGNOSIS — Z8249 Family history of ischemic heart disease and other diseases of the circulatory system: Secondary | ICD-10-CM | POA: Insufficient documentation

## 2016-11-23 DIAGNOSIS — J449 Chronic obstructive pulmonary disease, unspecified: Secondary | ICD-10-CM | POA: Insufficient documentation

## 2016-11-23 DIAGNOSIS — I10 Essential (primary) hypertension: Secondary | ICD-10-CM | POA: Diagnosis not present

## 2016-11-23 DIAGNOSIS — E785 Hyperlipidemia, unspecified: Secondary | ICD-10-CM | POA: Diagnosis not present

## 2016-11-23 DIAGNOSIS — Z8673 Personal history of transient ischemic attack (TIA), and cerebral infarction without residual deficits: Secondary | ICD-10-CM | POA: Insufficient documentation

## 2016-11-23 DIAGNOSIS — Z809 Family history of malignant neoplasm, unspecified: Secondary | ICD-10-CM | POA: Diagnosis not present

## 2016-11-23 DIAGNOSIS — Z87891 Personal history of nicotine dependence: Secondary | ICD-10-CM | POA: Diagnosis not present

## 2016-11-23 DIAGNOSIS — Z9889 Other specified postprocedural states: Secondary | ICD-10-CM | POA: Diagnosis not present

## 2016-11-23 MED ORDER — DILTIAZEM HCL 30 MG PO TABS: ORAL_TABLET | ORAL | 1 refills | Status: DC

## 2016-11-23 NOTE — Progress Notes (Signed)
Primary Care Physician: George Pinto, MD Referring Physician:Dr. Travez Stancil is a 71 y.o. male with a h/o afib, COPD, Depression, HLD, HTN, CVA, PVD with carotid disease, that was seen in the ER with afib that went on for several hours. Usually, he can take an extra 25 mg toprol and it will stop within the hour. He woke up in afib. He has recently restarted CPAP in the last month, having not used it for 3 years. Dr. Johnsie Cancel saw on consult in the ER, ordered a stress test as the pt had chest pain with the afib. This showed low risk. Dr. Johnsie Cancel wanted him to be considered for pill in pocket flecainide.   He denies any alcohol or tobacco use, or excessive caffeine. He is being compliant with cpap and was using the night he had afib. He does get a lot of his care at the [place] but does not have a cardiologist there. He can not remember an echo being done in the recent past.He is morbidly obese and inactive.  Today, he denies symptoms of palpitations, chest pain, shortness of breath, orthopnea, PND, lower extremity edema, dizziness, presyncope, syncope, or neurologic sequela. The patient is tolerating medications without difficulties and is otherwise without complaint today.   Past Medical History:  Diagnosis Date  . A-fib ([place])   . COPD (chronic obstructive pulmonary disease) ([place])   . Depression   . Hyperlipidemia   . Hypertension   . Stroke [facility])    Past Surgical History:  Procedure Laterality Date  . ANKLE SURGERY    . CAROTID ENDARTERECTOMY     06/2010    Current Outpatient Prescriptions  Medication Sig Dispense Refill  . atorvastatin (LIPITOR) 80 MG tablet Take 1 tablet (80 mg total) by mouth daily. 90 tablet 4  . budesonide-formoterol (SYMBICORT) 160-4.5 MCG/ACT inhaler Inhale 2 puffs into the lungs 2 (two) times daily.    . Cholecalciferol (VITAMIN D PO) Take 2,000 Units by mouth 2 (two) times daily.    . divalproex (DEPAKOTE ER) 500 MG 24 hr tablet Take 1 tablet 2 x /  day to prevent seizures (Patient taking differently: Take 500 mg by mouth 2 (two) times daily. to prevent seizures) 60 tablet 2  . doxazosin (CARDURA) 8 MG tablet Take 1 tablet (8 mg total) by mouth daily. (Patient taking differently: Take 8 mg by mouth at bedtime. ) 90 tablet 0  . metoprolol succinate (TOPROL-XL) 25 MG 24 hr tablet Take 1 tablet (25 mg total) by mouth 2 (two) times daily. 180 tablet 2  . montelukast (SINGULAIR) 10 MG tablet Take 1 tablet (10 mg total) by mouth daily. 30 tablet 2  . Multiple Vitamin (MULTIVITAMIN) tablet Take 1 tablet by mouth daily.    Marland Kitchen neomycin-polymyxin b-dexamethasone (MAXITROL) 3.5-10000-0.1 SUSP Place 1 drop into both eyes 4 (four) times daily. 5 mL 1  . phentermine (ADIPEX-P) 37.5 MG tablet Take 1 tablet (37.5 mg total) by mouth daily before breakfast. 30 tablet 2  . rivaroxaban (XARELTO) 20 MG TABS tablet Take 20 mg by mouth daily with supper.     . sertraline (ZOLOFT) 100 MG tablet Take 2 tablets (200 mg total) by mouth daily. (Patient taking differently: Take 100 mg by mouth daily. ) 180 tablet 3  . vitamin B-12 (CYANOCOBALAMIN) 1000 MCG tablet Take 1,000 mcg by mouth daily.    Marland Kitchen diltiazem (CARDIZEM) 30 MG tablet Take 1 tablet every 4 hours AS NEEDED for afib rapid heart rate over 100  45 tablet 1  . nitroGLYCERIN (NITROGLYN) 2 % ointment Apply 1 inch topically once.    . nitroGLYCERIN (NITROSTAT) 0.4 MG SL tablet Place 0.4 mg under the tongue once.     No current facility-administered medications for this encounter.     No Known Allergies  Social History   Social History  . Marital status: Widowed    Spouse name: N/A  . Number of children: N/A  . Years of education: N/A   Occupational History  . Not on file.   Social History Main Topics  . Smoking status: Former Smoker    Quit date: 05/11/2010  . Smokeless tobacco: Never Used  . Alcohol use No     Comment: rarely  . Drug use: No  . Sexual activity: Not on file   Other Topics Concern    . Not on file   Social History Narrative  . No narrative on file    Family History  Problem Relation Age of Onset  . Ovarian cancer Mother   . Hypertension Mother   . Hypertension Father   . Cancer Father   . Hypertension Brother   . Diabetes Brother   . Diabetes Daughter   . Diabetes Son   . Hypertension Sister     ROS- All systems are reviewed and negative except as per the HPI above  Physical Exam: Vitals:   11/23/16 1045  BP: 106/64  Pulse: 74  Weight: 297 lb 3.2 oz (134.8 kg)  Height: 5\' 10"  (1.778 m)   Wt Readings from Last 3 Encounters:  11/23/16 297 lb 3.2 oz (134.8 kg)  11/14/16 280 lb (127 kg)  11/07/16 280 lb (127 kg)    Labs: Lab Results  Component Value Date   NA 140 11/07/2016   K 4.2 11/07/2016   CL 106 11/07/2016   CO2 27 11/07/2016   GLUCOSE 131 (H) 11/07/2016   BUN 19 11/07/2016   CREATININE 1.14 11/07/2016   CALCIUM 8.7 (L) 11/07/2016   MG 1.9 04/17/2016   Lab Results  Component Value Date   INR 1.17 11/07/2016   Lab Results  Component Value Date   CHOL 125 04/17/2016   HDL 45 04/17/2016   LDLCALC 63 04/17/2016   TRIG 86 04/17/2016     GEN- The patient is well appearing, alert and oriented x 3 today.   Head- normocephalic, atraumatic Eyes-  Sclera clear, conjunctiva pink Ears- hearing intact Oropharynx- clear Neck- supple, no JVP Lymph- no cervical lymphadenopathy Lungs- Clear to ausculation bilaterally, normal work of breathing Heart- Regular rate and rhythm, no murmurs, rubs or gallops, PMI not laterally displaced GI- soft, NT, ND, + BS Extremities- no clubbing, cyanosis, or edema MS- no significant deformity or atrophy Skin- no rash or lesion Psych- euthymic mood, full affect Neuro- strength and sensation are intact  EKG-NSR at 74 bpm, pr int 146 ms, qrs int 86 ms, qtc 412 ms Epic records reviewed Stress Myoview-11/14/16-  Nuclear stress EF: 56%.  Probable normal perfusion and soft tissue attenuation (diaphragm)  No ischemia  Blood pressure demonstrated a normal response to exercise.  There was no ST segment deviation noted during stress.    Assessment and Plan- 1. Paroxysmal afib I would like to see if any structural heart disease is present before Rx'ing flecainide Will order Echo I would also like to see total afib burden as he may be better served using a daily antiarrythmic Will order 30 day event monitor I will also give 30 mg cardizem to  use for pill in pocket for now as he sounds he gets pretty good response form long acting metoprolol, Cardizem should be quicker onset  2. Lifestyle issues associated with afib Encouraged weight loss and regular exercise Encouraged regular use of cpap  3. HTN Stable  F/u in 6 weeks  Geroge Baseman. Reba Hulett, [facility] [address] [place], [place] [address] [phone]

## 2016-11-23 NOTE — Patient Instructions (Signed)
Cardizem 30mg  -- take 1 tablet every 4 hours AS NEEDED for afib rapid heart rate over 100 as long as top number of blood pressure over 100.

## 2016-12-07 ENCOUNTER — Encounter (HOSPITAL_COMMUNITY): Payer: Self-pay | Admitting: *Deleted

## 2016-12-10 ENCOUNTER — Ambulatory Visit (INDEPENDENT_AMBULATORY_CARE_PROVIDER_SITE_OTHER): Payer: Medicare Other

## 2016-12-10 ENCOUNTER — Ambulatory Visit (HOSPITAL_COMMUNITY): Payer: Medicare Other | Attending: Internal Medicine

## 2016-12-10 ENCOUNTER — Other Ambulatory Visit: Payer: Self-pay

## 2016-12-10 DIAGNOSIS — I482 Chronic atrial fibrillation, unspecified: Secondary | ICD-10-CM

## 2016-12-10 DIAGNOSIS — Z87891 Personal history of nicotine dependence: Secondary | ICD-10-CM | POA: Insufficient documentation

## 2016-12-10 DIAGNOSIS — Z8673 Personal history of transient ischemic attack (TIA), and cerebral infarction without residual deficits: Secondary | ICD-10-CM | POA: Diagnosis not present

## 2016-12-10 DIAGNOSIS — I10 Essential (primary) hypertension: Secondary | ICD-10-CM | POA: Diagnosis not present

## 2016-12-10 DIAGNOSIS — J449 Chronic obstructive pulmonary disease, unspecified: Secondary | ICD-10-CM | POA: Diagnosis not present

## 2016-12-10 DIAGNOSIS — E785 Hyperlipidemia, unspecified: Secondary | ICD-10-CM | POA: Diagnosis not present

## 2016-12-10 DIAGNOSIS — Z6841 Body Mass Index (BMI) 40.0 and over, adult: Secondary | ICD-10-CM | POA: Insufficient documentation

## 2017-01-04 DIAGNOSIS — Z23 Encounter for immunization: Secondary | ICD-10-CM | POA: Diagnosis not present

## 2017-01-17 ENCOUNTER — Ambulatory Visit (INDEPENDENT_AMBULATORY_CARE_PROVIDER_SITE_OTHER): Payer: Medicare Other | Admitting: Internal Medicine

## 2017-01-17 VITALS — BP 120/84 | HR 64 | Temp 97.3°F | Resp 18 | Ht 70.0 in | Wt 295.6 lb

## 2017-01-17 DIAGNOSIS — E782 Mixed hyperlipidemia: Secondary | ICD-10-CM

## 2017-01-17 DIAGNOSIS — E559 Vitamin D deficiency, unspecified: Secondary | ICD-10-CM

## 2017-01-17 DIAGNOSIS — Z79899 Other long term (current) drug therapy: Secondary | ICD-10-CM

## 2017-01-17 DIAGNOSIS — I10 Essential (primary) hypertension: Secondary | ICD-10-CM

## 2017-01-17 DIAGNOSIS — R7303 Prediabetes: Secondary | ICD-10-CM | POA: Diagnosis not present

## 2017-01-17 DIAGNOSIS — G40309 Generalized idiopathic epilepsy and epileptic syndromes, not intractable, without status epilepticus: Secondary | ICD-10-CM | POA: Diagnosis not present

## 2017-01-17 NOTE — Patient Instructions (Signed)

## 2017-01-18 ENCOUNTER — Encounter: Payer: Self-pay | Admitting: *Deleted

## 2017-01-18 LAB — VITAMIN D 25 HYDROXY (VIT D DEFICIENCY, FRACTURES): VIT D 25 HYDROXY: 49 ng/mL (ref 30–100)

## 2017-01-18 LAB — CBC WITH DIFFERENTIAL/PLATELET
BASOS ABS: 90 {cells}/uL (ref 0–200)
BASOS PCT: 1.7 %
EOS ABS: 159 {cells}/uL (ref 15–500)
Eosinophils Relative: 3 %
HCT: 39.2 % (ref 38.5–50.0)
Hemoglobin: 13.1 g/dL — ABNORMAL LOW (ref 13.2–17.1)
Lymphs Abs: 1314 cells/uL (ref 850–3900)
MCH: 29.5 pg (ref 27.0–33.0)
MCHC: 33.4 g/dL (ref 32.0–36.0)
MCV: 88.3 fL (ref 80.0–100.0)
MPV: 10.2 fL (ref 7.5–12.5)
Monocytes Relative: 10.8 %
NEUTROS PCT: 59.7 %
Neutro Abs: 3164 cells/uL (ref 1500–7800)
PLATELETS: 241 10*3/uL (ref 140–400)
RBC: 4.44 10*6/uL (ref 4.20–5.80)
RDW: 13.8 % (ref 11.0–15.0)
TOTAL LYMPHOCYTE: 24.8 %
WBC: 5.3 10*3/uL (ref 3.8–10.8)
WBCMIX: 572 {cells}/uL (ref 200–950)

## 2017-01-18 LAB — BASIC METABOLIC PANEL WITH GFR
BUN: 20 mg/dL (ref 7–25)
CHLORIDE: 107 mmol/L (ref 98–110)
CO2: 29 mmol/L (ref 20–32)
Calcium: 8.6 mg/dL (ref 8.6–10.3)
Creat: 0.88 mg/dL (ref 0.70–1.18)
GFR, EST AFRICAN AMERICAN: 99 mL/min/{1.73_m2} (ref >=60)
GFR, Est Non African American: 85 mL/min/{1.73_m2} (ref >=60)
Glucose, Bld: 122 mg/dL — ABNORMAL HIGH (ref 65–99)
Potassium: 4.4 mmol/L (ref 3.5–5.3)
Sodium: 143 mmol/L (ref 135–146)

## 2017-01-18 LAB — HEPATIC FUNCTION PANEL
AG RATIO: 1.9 (calc) (ref 1.0–2.5)
ALT: 15 U/L (ref 9–46)
AST: 11 U/L (ref 10–35)
Albumin: 3.9 g/dL (ref 3.6–5.1)
Alkaline phosphatase (APISO): 40 U/L (ref 40–115)
BILIRUBIN DIRECT: 0.1 mg/dL (ref 0.0–0.2)
BILIRUBIN INDIRECT: 0.2 mg/dL (ref 0.2–1.2)
GLOBULIN: 2.1 g/dL (ref 1.9–3.7)
Total Bilirubin: 0.3 mg/dL (ref 0.2–1.2)
Total Protein: 6 g/dL — ABNORMAL LOW (ref 6.1–8.1)

## 2017-01-18 LAB — LIPID PANEL
CHOL/HDL RATIO: 2.5 (calc) (ref <5.0)
Cholesterol: 106 mg/dL (ref <200)
HDL: 42 mg/dL (ref >40)
LDL CHOLESTEROL (CALC): 50 mg/dL
NON-HDL CHOLESTEROL (CALC): 64 mg/dL (ref <130)
Triglycerides: 60 mg/dL (ref <150)

## 2017-01-18 LAB — VALPROIC ACID LEVEL: Valproic Acid Lvl: 64 mg/L (ref 50.0–100.0)

## 2017-01-18 LAB — INSULIN, RANDOM: INSULIN: 47.2 u[IU]/mL — AB (ref 2.0–19.6)

## 2017-01-18 LAB — HEMOGLOBIN A1C
HEMOGLOBIN A1C: 6 %{Hb} — AB (ref <5.7)
MEAN PLASMA GLUCOSE: 126 (calc)
eAG (mmol/L): 7 (calc)

## 2017-01-18 LAB — TSH: TSH: 1.4 m[IU]/L (ref 0.40–4.50)

## 2017-01-18 LAB — MAGNESIUM: Magnesium: 1.9 mg/dL (ref 1.5–2.5)

## 2017-01-19 ENCOUNTER — Encounter: Payer: Self-pay | Admitting: Internal Medicine

## 2017-01-19 NOTE — Progress Notes (Signed)
This very nice 71 y.o. MWM presents for 3 month follow up with Hypertension, ASHD/pAfib, ASCVD Hyperlipidemia, Pre-Diabetes and Vitamin D Deficiency.      Patient is treated for HTN circa 1997  & BP has been controlled at home. Today's BP is at goal -  120/84. In 2012 , he was hospitalized with a non-focal CVA and denies k/o seizures since then. In 2013, he has a negative stress Myoview. In 2015 he was started on Xarelto  For rapid Afib. Patient has had no complaints of any cardiac type chest pain, palpitations, dyspnea / orthopnea / PND, dizziness, claudication, or dependent edema.     Hyperlipidemia is controlled with diet & meds. Patient denies myalgias or other med SE's. Last Lipids were  Lab Results  Component Value Date   CHOL 106 01/17/2017   HDL 42 01/17/2017   LDLCALC 63 04/17/2016   TRIG 60 01/17/2017   CHOLHDL 2.5 01/17/2017      Also, the patient has history of Severe Morbid Obesity (BMI 42+) consequent of Gluttony and compulsive over eating and therefore has PreDiabetes ( A1c was  6.0% / 2011and then 6.8% in 2012 and since has been less than 6.5%).  He symptoms of reactive hypoglycemia, diabetic polys, paresthesias or visual blurring.  Last A1c was  Lab Results  Component Value Date   HGBA1C 6.0 (H) 01/17/2017      Further, the patient also has history of Vitamin D Deficiency (44" in 2012and supplements vitamin D without any suspected side-effects. Last vitamin D was   Lab Results  Component Value Date   VD25OH 49 01/17/2017   Current Outpatient Medications on File Prior to Visit  Medication Sig  . atorvastatin (LIPITOR) 80 MG tablet Take 1 tablet (80 mg total) by mouth daily.  . budesonide-formoterol (SYMBICORT) 160-4.5 MCG/ACT inhaler Inhale 2 puffs into the lungs 2 (two) times daily.  . Cholecalciferol (VITAMIN D PO) Take 2,000 Units by mouth 2 (two) times daily.  Marland Kitchen diltiazem (CARDIZEM) 30 MG tablet Take 1 tablet every 4 hours AS NEEDED for afib rapid heart rate  over 100  . divalproex (DEPAKOTE ER) 500 MG 24 hr tablet Take 1 tablet 2 x / day to prevent seizures (Patient taking differently: Take 500 mg by mouth 2 (two) times daily. to prevent seizures)  . doxazosin (CARDURA) 8 MG tablet Take 1 tablet (8 mg total) by mouth daily. (Patient taking differently: Take 8 mg by mouth at bedtime. )  . metoprolol succinate (TOPROL-XL) 25 MG 24 hr tablet Take 1 tablet (25 mg total) by mouth 2 (two) times daily.  . montelukast (SINGULAIR) 10 MG tablet Take 1 tablet (10 mg total) by mouth daily.  . Multiple Vitamin (MULTIVITAMIN) tablet Take 1 tablet by mouth daily.  Marland Kitchen neomycin-polymyxin b-dexamethasone (MAXITROL) 3.5-10000-0.1 SUSP Place 1 drop into both eyes 4 (four) times daily.  . nitroGLYCERIN (NITROSTAT) 0.4 MG SL tablet Place 0.4 mg under the tongue once.  . phentermine (ADIPEX-P) 37.5 MG tablet Take 1 tablet (37.5 mg total) by mouth daily before breakfast. (Patient not taking: Reported on 01/17/2017)  . rivaroxaban (XARELTO) 20 MG TABS tablet Take 20 mg by mouth daily with supper.   . sertraline (ZOLOFT) 100 MG tablet Take 2 tablets (200 mg total) by mouth daily. (Patient taking differently: Take 100 mg by mouth daily. )  . vitamin B-12 (CYANOCOBALAMIN) 1000 MCG tablet Take 1,000 mcg by mouth daily.   No current facility-administered medications on file prior to visit.  No Known Allergies PMHx:   Past Medical History:  Diagnosis Date  . A-fib ([place])   . COPD (chronic obstructive pulmonary disease) ([employer])   . Depression   . Hyperlipidemia   . Hypertension   . Stroke [facility])    Immunization History  Administered Date(s) Administered  . Influenza Whole 01/19/2011, 01/22/2012  . Influenza, High Dose Seasonal PF 12/01/2015  . Influenza-Unspecified 12/28/2012, 12/10/2013, 12/10/2016  . Pneumococcal Conjugate-13 10/10/2013  . Pneumococcal-Unspecified 03/12/2009  . Td 03/12/2006   Past Surgical History:  Procedure Laterality Date  . ANKLE SURGERY    .  CAROTID ENDARTERECTOMY     06/2010   FHx:    Reviewed / unchanged  SHx:    Reviewed / unchanged  Systems Review:  Constitutional: Denies fever, chills, wt changes, headaches, insomnia, fatigue, night sweats, change in appetite. Eyes: Denies redness, blurred vision, diplopia, discharge, itchy, watery eyes.  ENT: Denies discharge, congestion, post nasal drip, epistaxis, sore throat, earache, hearing loss, dental pain, tinnitus, vertigo, sinus pain, snoring.  CV: Denies chest pain, palpitations, irregular heartbeat, syncope, dyspnea, diaphoresis, orthopnea, PND, claudication or edema. Respiratory: denies cough, dyspnea, DOE, pleurisy, hoarseness, laryngitis, wheezing.  Gastrointestinal: Denies dysphagia, odynophagia, heartburn, reflux, water brash, abdominal pain or cramps, nausea, vomiting, bloating, diarrhea, constipation, hematemesis, melena, hematochezia  or hemorrhoids. Genitourinary: Denies dysuria, frequency, urgency, nocturia, hesitancy, discharge, hematuria or flank pain. Musculoskeletal: Denies arthralgias, myalgias, stiffness, jt. swelling, pain, limping or strain/sprain.  Skin: Denies pruritus, rash, hives, warts, acne, eczema or change in skin lesion(s). Neuro: No weakness, tremor, incoordination, spasms, paresthesia or pain. Psychiatric: Denies confusion, memory loss or sensory loss. Endo: Denies change in weight, skin or hair change.  Heme/Lymph: No excessive bleeding, bruising or enlarged lymph nodes.  Physical Exam  BP 120/84   Pulse 64   Temp (!) 97.3 F (36.3 C)   Resp 18   Ht 5\' 10"  (1.778 m)   Wt 295 lb 9.6 oz (134.1 kg)   BMI 42.41 kg/m   Appears well nourished, well groomed  and in no distress.  Eyes: PERRLA, EOMs, conjunctiva no swelling or erythema. Sinuses: No frontal/maxillary tenderness ENT/Mouth: EAC's clear, TM's nl w/o erythema, bulging. Nares clear w/o erythema, swelling, exudates. Oropharynx clear without erythema or exudates. Oral hygiene is good.  Tongue normal, non obstructing. Hearing intact.  Neck: Supple. Thyroid nl. Car 2+/2+ without bruits, nodes or JVD. Chest: Respirations nl with BS clear & equal w/o rales, rhonchi, wheezing or stridor.  Cor: Heart sounds soft w/ sl irregular rate and rhythm without sig. murmurs. Peripheral pulses 1+/1+ and equal  With trace ankle/pedal edema.  Abdomen: Soft & bowel sounds normal. Non-tender w/o guarding, rebound, hernias, masses or organomegaly.  Lymphatics: Unremarkable.  Musculoskeletal: Full ROM all peripheral extremities, joint stability, 5/5 strength and normal gait.  Skin: Warm, dry without exposed rashes, lesions or ecchymosis apparent.  Neuro: Cranial nerves intact, reflexes equal bilaterally. Sensory-motor testing grossly intact. Tendon reflexes grossly intact.  Pysch: Alert & oriented x 3.  Insight and judgement nl & appropriate. No ideations.  Assessment and Plan:  1. Essential hypertension  - Continue medication, monitor blood pressure at home.  - Continue DASH diet. Reminder to go to the ER if any CP,  SOB, nausea, dizziness, severe HA, changes vision/speech.      - CBC with Differential/Platelet - BASIC METABOLIC PANEL WITH GFR - Magnesium - TSH  2. Hyperlipidemia, mixed  - Continue diet/meds, exercise,& lifestyle modifications.  - Continue monitor periodic cholesterol/liver & renal functions   -  Hepatic function panel - Lipid panel - TSH  3. Prediabetes  - Continue diet, exercise, lifestyle modifications.  - Monitor appropriate labs.  - Hemoglobin A1c - Insulin, random  4. Vitamin D deficiency  - Continue supplementation.  - VITAMIN D 25 Hydroxy   5. Generalized convulsive epilepsy ([place])  - Valproic acid level  6. Medication management  - CBC with Differential/Platelet - BASIC METABOLIC PANEL WITH GFR - Hepatic function panel - Magnesium - Lipid panel - TSH - Hemoglobin A1c - Insulin, random - VITAMIN D 25 Hydroxy - Valproic acid  level        Discussed  regular exercise, BP monitoring, weight control to achieve/maintain BMI less than 25 and discussed med and SE's. Recommended labs to assess and monitor clinical status with further disposition pending results of labs. Over 30 minutes of exam, counseling, chart review was performed.

## 2017-01-25 ENCOUNTER — Encounter (HOSPITAL_COMMUNITY): Payer: Self-pay | Admitting: Nurse Practitioner

## 2017-01-25 ENCOUNTER — Ambulatory Visit (HOSPITAL_COMMUNITY)
Admission: RE | Admit: 2017-01-25 | Discharge: 2017-01-25 | Disposition: A | Discharge status: Home / Self Care | Payer: Medicare Other | Source: Ambulatory Visit | Attending: Nurse Practitioner | Admitting: Nurse Practitioner

## 2017-01-25 VITALS — BP 132/84 | HR 77 | Ht 70.0 in | Wt 295.0 lb

## 2017-01-25 DIAGNOSIS — Z7901 Long term (current) use of anticoagulants: Secondary | ICD-10-CM | POA: Insufficient documentation

## 2017-01-25 DIAGNOSIS — Z79899 Other long term (current) drug therapy: Secondary | ICD-10-CM | POA: Diagnosis not present

## 2017-01-25 DIAGNOSIS — I350 Nonrheumatic aortic (valve) stenosis: Secondary | ICD-10-CM | POA: Diagnosis not present

## 2017-01-25 DIAGNOSIS — Z87891 Personal history of nicotine dependence: Secondary | ICD-10-CM | POA: Diagnosis not present

## 2017-01-25 DIAGNOSIS — I739 Peripheral vascular disease, unspecified: Secondary | ICD-10-CM | POA: Diagnosis not present

## 2017-01-25 DIAGNOSIS — J449 Chronic obstructive pulmonary disease, unspecified: Secondary | ICD-10-CM | POA: Diagnosis not present

## 2017-01-25 DIAGNOSIS — F329 Major depressive disorder, single episode, unspecified: Secondary | ICD-10-CM | POA: Insufficient documentation

## 2017-01-25 DIAGNOSIS — I10 Essential (primary) hypertension: Secondary | ICD-10-CM | POA: Insufficient documentation

## 2017-01-25 DIAGNOSIS — Z8673 Personal history of transient ischemic attack (TIA), and cerebral infarction without residual deficits: Secondary | ICD-10-CM | POA: Diagnosis not present

## 2017-01-25 DIAGNOSIS — I480 Paroxysmal atrial fibrillation: Secondary | ICD-10-CM | POA: Insufficient documentation

## 2017-01-25 DIAGNOSIS — E785 Hyperlipidemia, unspecified: Secondary | ICD-10-CM | POA: Insufficient documentation

## 2017-01-25 NOTE — Progress Notes (Signed)
Primary Care Physician: Unk Pinto, MD Referring Physician:Dr. Jaja Switalski is a 71 y.o. male with a h/o afib, COPD, Depression, HLD, HTN, CVA, PVD with carotid disease, that was seen in the ER with afib that went on for several hours. Usually, he can take an extra 25 mg toprol and it will stop within the hour. He woke up in afib. He has recently restarted CPAP in the last month, having not used it for 3 years. Dr. Johnsie Cancel saw on consult in the ER, ordered a stress test as the pt had chest pain with the afib. This showed low risk. Dr. Johnsie Cancel wanted him to be considered for pill in pocket flecainide.   He denies any alcohol or tobacco use, or excessive caffeine. He is being compliant with cpap and was using the night he had afib. He does get a lot of his care at the [place] but does not have a cardiologist there. He can not remember an echo being done in the recent past.He is morbidly obese and inactive.  F/u in afib clinic, 11/16. Echo was obtained that showed normal function with mild aortic stenosis. 30 day monitor showed no arrhythmia. He has no complaints today.  Today, he denies symptoms of palpitations, chest pain, shortness of breath, orthopnea, PND, lower extremity edema, dizziness, presyncope, syncope, or neurologic sequela. The patient is tolerating medications without difficulties and is otherwise without complaint today.   Past Medical History:  Diagnosis Date  . A-fib ([place])   . COPD (chronic obstructive pulmonary disease) ([place])   . Depression   . Hyperlipidemia   . Hypertension   . Stroke [facility])    Past Surgical History:  Procedure Laterality Date  . ANKLE SURGERY    . CAROTID ENDARTERECTOMY     06/2010    Current Outpatient Medications  Medication Sig Dispense Refill  . atorvastatin (LIPITOR) 80 MG tablet Take 1 tablet (80 mg total) by mouth daily. 90 tablet 4  . budesonide-formoterol (SYMBICORT) 160-4.5 MCG/ACT inhaler Inhale 2 puffs into the lungs 2 (two)  times daily.    . Cholecalciferol (VITAMIN D PO) Take 2,000 Units by mouth 2 (two) times daily.    Marland Kitchen diltiazem (CARDIZEM) 30 MG tablet Take 1 tablet every 4 hours AS NEEDED for afib rapid heart rate over 100 45 tablet 1  . divalproex (DEPAKOTE ER) 500 MG 24 hr tablet Take 1 tablet 2 x / day to prevent seizures (Patient taking differently: Take 500 mg by mouth 2 (two) times daily. to prevent seizures) 60 tablet 2  . doxazosin (CARDURA) 8 MG tablet Take 1 tablet (8 mg total) by mouth daily. (Patient taking differently: Take 8 mg by mouth at bedtime. ) 90 tablet 0  . metoprolol succinate (TOPROL-XL) 25 MG 24 hr tablet Take 1 tablet (25 mg total) by mouth 2 (two) times daily. 180 tablet 2  . montelukast (SINGULAIR) 10 MG tablet Take 1 tablet (10 mg total) by mouth daily. 30 tablet 2  . Multiple Vitamin (MULTIVITAMIN) tablet Take 1 tablet by mouth daily.    Marland Kitchen neomycin-polymyxin b-dexamethasone (MAXITROL) 3.5-10000-0.1 SUSP Place 1 drop into both eyes 4 (four) times daily. 5 mL 1  . nitroGLYCERIN (NITROSTAT) 0.4 MG SL tablet Place 0.4 mg under the tongue once.    . rivaroxaban (XARELTO) 20 MG TABS tablet Take 20 mg by mouth daily with supper.     . sertraline (ZOLOFT) 100 MG tablet Take 2 tablets (200 mg total) by mouth daily. (Patient taking  differently: Take 100 mg by mouth daily. ) 180 tablet 3  . vitamin B-12 (CYANOCOBALAMIN) 1000 MCG tablet Take 1,000 mcg by mouth daily.    . phentermine (ADIPEX-P) 37.5 MG tablet Take 1 tablet (37.5 mg total) by mouth daily before breakfast. (Patient not taking: Reported on 01/17/2017) 30 tablet 2   No current facility-administered medications for this encounter.     No Known Allergies  Social History   Socioeconomic History  . Marital status: Widowed    Spouse name: Not on file  . Number of children: Not on file  . Years of education: Not on file  . Highest education level: Not on file  Social Needs  . Financial resource strain: Not on file  . Food  insecurity - worry: Not on file  . Food insecurity - inability: Not on file  . Transportation needs - medical: Not on file  . Transportation needs - non-medical: Not on file  Occupational History  . Not on file  Tobacco Use  . Smoking status: Former Smoker    Last attempt to quit: 05/11/2010    Years since quitting: 6.7  . Smokeless tobacco: Never Used  Substance and Sexual Activity  . Alcohol use: No    Alcohol/week: 0.0 oz    Comment: rarely  . Drug use: No  . Sexual activity: Not on file  Other Topics Concern  . Not on file  Social History Narrative  . Not on file    Family History  Problem Relation Age of Onset  . Ovarian cancer Mother   . Hypertension Mother   . Hypertension Father   . Cancer Father   . Hypertension Brother   . Diabetes Brother   . Diabetes Daughter   . Diabetes Son   . Hypertension Sister     ROS- All systems are reviewed and negative except as per the HPI above  Physical Exam: Vitals:   01/25/17 1033  BP: 132/84  Pulse: 77  Weight: 295 lb (133.8 kg)  Height: 5\' 10"  (1.778 m)   Wt Readings from Last 3 Encounters:  01/25/17 295 lb (133.8 kg)  01/17/17 295 lb 9.6 oz (134.1 kg)  11/23/16 297 lb 3.2 oz (134.8 kg)    Labs: Lab Results  Component Value Date   NA 143 01/17/2017   K 4.4 01/17/2017   CL 107 01/17/2017   CO2 29 01/17/2017   GLUCOSE 122 (H) 01/17/2017   BUN 20 01/17/2017   CREATININE 0.88 01/17/2017   CALCIUM 8.6 01/17/2017   MG 1.9 01/17/2017   Lab Results  Component Value Date   INR 1.17 11/07/2016   Lab Results  Component Value Date   CHOL 106 01/17/2017   HDL 42 01/17/2017   LDLCALC 63 04/17/2016   TRIG 60 01/17/2017     GEN- The patient is well appearing, alert and oriented x 3 today.   Head- normocephalic, atraumatic Eyes-  Sclera clear, conjunctiva pink Ears- hearing intact Oropharynx- clear Neck- supple, no JVP Lymph- no cervical lymphadenopathy Lungs- Clear to ausculation bilaterally, normal work  of breathing Heart- Regular rate and rhythm, no murmurs, rubs or gallops, PMI not laterally displaced GI- soft, NT, ND, + BS Extremities- no clubbing, cyanosis, or edema MS- no significant deformity or atrophy Skin- no rash or lesion Psych- euthymic mood, full affect Neuro- strength and sensation are intact  EKG-NSR at 74 bpm, pr int 146 ms, qrs int 86 ms, qtc 412 ms Epic records reviewed Stress Myoview-11/14/16-  Nuclear stress EF:  56%.  Probable normal perfusion and soft tissue attenuation (diaphragm) No ischemia  Blood pressure demonstrated a normal response to exercise.  There was no ST segment deviation noted during stress.   Echo-Study Conclusions  - Left ventricle: The cavity size was normal. Wall thickness was   normal. Systolic function was normal. The estimated ejection   fraction was in the range of 60% to 65%. - Aortic valve: AV is thickened, calcified with restricted motion .   Peak and mean gradients through the valve are 43 and 24 mm Hg   respectively consistent with mild AS - Right ventricle: The cavity size was mildly dilated. Wall   thickness was normal.  - Pulmonary arteries: PA peak pressure: 33 mm Hg (S).  Event monitor-[facility] Tobie Lords  Order# [id number]  Reading physician: Josue Hector, MD Ordering physician: Sherran Needs, NP Study date: 12/10/16  Patient Information   Name MRN Description  George Delgado [id number] 71 y.o. Male  Result Notes for Cardiac event monitor   Notes recorded by Michaelyn Barter, RN on 01/17/2017 at 3:42 PM EST Left message for patient to call back. ------  Notes recorded by Josue Hector, MD on 01/16/2017 at 4:29 PM EST No significant arrhythmias    NSR Rare unifocal PVC;s No significant arrhythmias      Assessment and Plan- 1. Paroxysmal afib Recent echo and stress test ok, mild aortic stenosis which will need long term f/u Event monitor showed no  arrhythmia, Pt denies any further afib General education re afib Pt has been given 30 mg Cardizem to use if afib should return, how to use Triggers discussed and when appropriate to go to the ER Continue xarelto with a chadsvasc score of at lest 4  2. Lifestyle issues associated with afib Encouraged weight loss and regular exercise Encouraged regular use of cpap  3. HTN Stable  F/u with Dr. Johnsie Cancel in 3-4 months  Geroge Baseman. Ladye Macnaughton, [facility] [address] [place],  [address] [phone]

## 2017-01-25 NOTE — Patient Instructions (Signed)
Take one tablet by mouth every 4 hours AS NEEDED for A-fib HR > 100 as long as BP > 100.   Please pick this up at your pharmacy

## 2017-04-05 ENCOUNTER — Ambulatory Visit: Payer: Medicare Other | Admitting: Cardiovascular Disease

## 2017-04-16 ENCOUNTER — Encounter: Payer: Self-pay | Admitting: Internal Medicine

## 2017-04-16 NOTE — Progress Notes (Deleted)
Cardiology Office Note   Date:  04/16/2017   ID:  George Delgado, DOB [birth year], MRN [id number]  PCP:  Unk Pinto, MD  Cardiologist:   Jenkins Rouge, MD   No chief complaint on file.     History of Present Illness: George Delgado is a 72 y.o. male who presents for f/u PAF. History of stroke, HTN, HLD and COPD Initially seen In consult 11/07/16 [facility]. Normal myovue 2013 First diagnosis afib 2013 Been on xarelto for CHA2VASC 4 Converted in ER with cardizem. Seen in afib clinic by Doristine Devoid 01/25/17 in [facility] doing well  OSA wearing CPAP No ETOH , smoking or excess caffeine Obese and inactive  Only given script for SA cardizem and not Pill in pocket flecainide   Myovue reviewed 11/15/16 EF 56% diaphragmatic attenuation no ischemia Echo reviewed 12/10/16 EF 60-65% mild to moderate AS mean gradient 24 peak 43 mmHg  F/U Event monitor with no PAF  ***   Past Medical History:  Diagnosis Date  . A-fib ([place])   . COPD (chronic obstructive pulmonary disease) ([place])   . Depression   . Hyperlipidemia   . Hypertension   . Stroke [facility])     Past Surgical History:  Procedure Laterality Date  . ANKLE SURGERY    . CAROTID ENDARTERECTOMY     06/2010     Current Outpatient Medications  Medication Sig Dispense Refill  . atorvastatin (LIPITOR) 80 MG tablet Take 1 tablet (80 mg total) by mouth daily. 90 tablet 4  . budesonide-formoterol (SYMBICORT) 160-4.5 MCG/ACT inhaler Inhale 2 puffs into the lungs 2 (two) times daily.    . Cholecalciferol (VITAMIN D PO) Take 2,000 Units by mouth 2 (two) times daily.    Marland Kitchen diltiazem (CARDIZEM) 30 MG tablet Take 1 tablet every 4 hours AS NEEDED for afib rapid heart rate over 100 45 tablet 1  . divalproex (DEPAKOTE ER) 500 MG 24 hr tablet Take 1 tablet 2 x / day to prevent seizures (Patient taking differently: Take 500 mg by mouth 2 (two) times daily. to prevent seizures) 60 tablet 2  . doxazosin (CARDURA) 8 MG tablet Take 1 tablet (8 mg total) by mouth  daily. (Patient taking differently: Take 8 mg by mouth at bedtime. ) 90 tablet 0  . metoprolol succinate (TOPROL-XL) 25 MG 24 hr tablet Take 1 tablet (25 mg total) by mouth 2 (two) times daily. 180 tablet 2  . montelukast (SINGULAIR) 10 MG tablet Take 1 tablet (10 mg total) by mouth daily. 30 tablet 2  . Multiple Vitamin (MULTIVITAMIN) tablet Take 1 tablet by mouth daily.    Marland Kitchen neomycin-polymyxin b-dexamethasone (MAXITROL) 3.5-10000-0.1 SUSP Place 1 drop into both eyes 4 (four) times daily. 5 mL 1  . nitroGLYCERIN (NITROSTAT) 0.4 MG SL tablet Place 0.4 mg under the tongue once.    . phentermine (ADIPEX-P) 37.5 MG tablet Take 1 tablet (37.5 mg total) by mouth daily before breakfast. (Patient not taking: Reported on 01/17/2017) 30 tablet 2  . rivaroxaban (XARELTO) 20 MG TABS tablet Take 20 mg by mouth daily with supper.     . sertraline (ZOLOFT) 100 MG tablet Take 2 tablets (200 mg total) by mouth daily. (Patient taking differently: Take 100 mg by mouth daily. ) 180 tablet 3  . vitamin B-12 (CYANOCOBALAMIN) 1000 MCG tablet Take 1,000 mcg by mouth daily.     No current facility-administered medications for this visit.     Allergies:   Patient has no known allergies.  Social History:  The patient  reports that he quit smoking about 6 years ago. he has never used smokeless tobacco. He reports that he does not drink alcohol or use drugs.   Family History:  The patient's family history includes Cancer in his father; Diabetes in his brother, daughter, and son; Hypertension in his brother, father, mother, and sister; Ovarian cancer in his mother.    ROS:  Please see the history of present illness.   Otherwise, review of systems are positive for none.   All other systems are reviewed and negative.    PHYSICAL EXAM: VS:  There were no vitals taken for this visit. , BMI There is no height or weight on file to calculate BMI. Affect appropriate Obese male  HEENT: normal Neck supple with no  adenopathy JVP normal no bruits no thyromegaly Lungs clear with no wheezing and good diaphragmatic motion Heart:  S1/S2 no murmur, no rub, gallop or click PMI normal Abdomen: benighn, BS positve, no tenderness, no AAA no bruit.  No HSM or HJR Distal pulses intact with no bruits No edema Neuro non-focal Skin warm and dry No muscular weakness     EKG:  01/25/17 SR rate 77 normal    Recent Labs: 11/07/2016: B Natriuretic Peptide 113.0 01/17/2017: ALT 15; BUN 20; Creat 0.88; Hemoglobin 13.1; Magnesium 1.9; Platelets 241; Potassium 4.4; Sodium 143; TSH 1.40    Lipid Panel    Component Value Date/Time   CHOL 106 01/17/2017 1216   TRIG 60 01/17/2017 1216   HDL 42 01/17/2017 1216   CHOLHDL 2.5 01/17/2017 1216   VLDL 17 04/17/2016 1535   LDLCALC 63 04/17/2016 1535      Wt Readings from Last 3 Encounters:  01/25/17 295 lb (133.8 kg)  01/17/17 295 lb 9.6 oz (134.1 kg)  11/23/16 297 lb 3.2 oz (134.8 kg)      Other studies Reviewed: Additional studies/ records that were reviewed today include: Consult note Augus 2018 echo, myovue , event monitor Labs ECG and afib clinic note Doristine Devoid .    ASSESSMENT AND PLAN:  1.  PAF:  Stable CHA2VASC 4 continue xarelto and Toprol Discussed pill in pocket flecainide *** 2. HTN;  Well controlled.  Continue current medications and low sodium Dash type diet.   3. OSA:  Advised continued CPAP and weight loss 4. HLD:  Continue statin labs at [facility] 5. Depression: on Zoloft f/u primary   Current medicines are reviewed at length with the patient today.  The patient does not have concerns regarding medicines.  The following changes have been made:  no change  Labs/ tests ordered today include: None  No orders of the defined types were placed in this encounter.    Disposition:   FU with cardiology in a year      Signed, Jenkins Rouge, MD  04/16/2017 11:35 AM    [address], [place], [place]   [address] Phone: [phone]; Fax: [phone]

## 2017-04-24 ENCOUNTER — Ambulatory Visit: Payer: Medicare Other | Admitting: Cardiovascular Disease

## 2017-04-29 ENCOUNTER — Ambulatory Visit (INDEPENDENT_AMBULATORY_CARE_PROVIDER_SITE_OTHER): Payer: Medicare Other | Admitting: Internal Medicine

## 2017-04-29 ENCOUNTER — Other Ambulatory Visit: Payer: Self-pay | Admitting: *Deleted

## 2017-04-29 ENCOUNTER — Encounter: Payer: Self-pay | Admitting: Internal Medicine

## 2017-04-29 VITALS — BP 138/84 | HR 72 | Temp 97.7°F | Resp 18 | Ht 69.0 in | Wt 293.8 lb

## 2017-04-29 DIAGNOSIS — Z8249 Family history of ischemic heart disease and other diseases of the circulatory system: Secondary | ICD-10-CM

## 2017-04-29 DIAGNOSIS — G4733 Obstructive sleep apnea (adult) (pediatric): Secondary | ICD-10-CM

## 2017-04-29 DIAGNOSIS — F329 Major depressive disorder, single episode, unspecified: Secondary | ICD-10-CM

## 2017-04-29 DIAGNOSIS — Z125 Encounter for screening for malignant neoplasm of prostate: Secondary | ICD-10-CM

## 2017-04-29 DIAGNOSIS — Z1212 Encounter for screening for malignant neoplasm of rectum: Secondary | ICD-10-CM

## 2017-04-29 DIAGNOSIS — I10 Essential (primary) hypertension: Secondary | ICD-10-CM

## 2017-04-29 DIAGNOSIS — G40909 Epilepsy, unspecified, not intractable, without status epilepticus: Secondary | ICD-10-CM

## 2017-04-29 DIAGNOSIS — I482 Chronic atrial fibrillation, unspecified: Secondary | ICD-10-CM

## 2017-04-29 DIAGNOSIS — R7309 Other abnormal glucose: Secondary | ICD-10-CM

## 2017-04-29 DIAGNOSIS — Z136 Encounter for screening for cardiovascular disorders: Secondary | ICD-10-CM

## 2017-04-29 DIAGNOSIS — N401 Enlarged prostate with lower urinary tract symptoms: Secondary | ICD-10-CM

## 2017-04-29 DIAGNOSIS — F32A Depression, unspecified: Secondary | ICD-10-CM

## 2017-04-29 DIAGNOSIS — J449 Chronic obstructive pulmonary disease, unspecified: Secondary | ICD-10-CM

## 2017-04-29 DIAGNOSIS — Z23 Encounter for immunization: Secondary | ICD-10-CM | POA: Diagnosis not present

## 2017-04-29 DIAGNOSIS — E559 Vitamin D deficiency, unspecified: Secondary | ICD-10-CM | POA: Diagnosis not present

## 2017-04-29 DIAGNOSIS — Z79899 Other long term (current) drug therapy: Secondary | ICD-10-CM | POA: Diagnosis not present

## 2017-04-29 DIAGNOSIS — H1013 Acute atopic conjunctivitis, bilateral: Secondary | ICD-10-CM

## 2017-04-29 DIAGNOSIS — R7303 Prediabetes: Secondary | ICD-10-CM

## 2017-04-29 DIAGNOSIS — Z6841 Body Mass Index (BMI) 40.0 and over, adult: Secondary | ICD-10-CM

## 2017-04-29 DIAGNOSIS — I2510 Atherosclerotic heart disease of native coronary artery without angina pectoris: Secondary | ICD-10-CM

## 2017-04-29 DIAGNOSIS — Z1211 Encounter for screening for malignant neoplasm of colon: Secondary | ICD-10-CM

## 2017-04-29 DIAGNOSIS — E782 Mixed hyperlipidemia: Secondary | ICD-10-CM | POA: Diagnosis not present

## 2017-04-29 DIAGNOSIS — Z87891 Personal history of nicotine dependence: Secondary | ICD-10-CM

## 2017-04-29 DIAGNOSIS — Z9989 Dependence on other enabling machines and devices: Secondary | ICD-10-CM

## 2017-04-29 MED ORDER — SERTRALINE HCL 100 MG PO TABS: ORAL_TABLET | ORAL | 3 refills | Status: DC

## 2017-04-29 MED ORDER — NEOMYCIN-POLYMYXIN-DEXAMETH 3.5-10000-0.1 OP SUSP: OPHTHALMIC | 3 refills | Status: DC

## 2017-04-29 MED ORDER — PHENTERMINE HCL 37.5 MG PO TABS: ORAL_TABLET | ORAL | 5 refills | Status: DC

## 2017-04-29 NOTE — Patient Instructions (Signed)

## 2017-04-29 NOTE — Progress Notes (Signed)
ADULT & ADOLESCENT INTERNAL MEDICINE   Unk Pinto, M.D.     Uvaldo Bristle. Silverio Lay, P.A.-C Liane Comber, [place]                [address] [place] [address] Telephone [phone] Telefax [phone] Comprehensive Evaluation & Examination     This very nice 72 y.o. MWM  presents for a  comprehensive evaluation and management of multiple medical co-morbidities.  Patient has been followed for HTN, ASHD/Afib, ASCVD/CVA's,  Seizure Disorder, Prediabetes, HLD and Vitamin D Deficiency. Patient has OSA on CPAP and endorse improved restorative sleep.      HTN predates since 1997. In 2012, he was hospitalized & recovered from a non-foval CVA. In 2013 he had a focal seizure attributed to his hx/o CVA. In Feb 2018, he was switched from [place] to Depakote because of aggressive behaviors.  He had a negative stress Myoview in 2013 at the [place]. In 2015, he was started on Xarelto for rapid Afib. Patient's BP has been controlled at home.  Today's BP was initially elevated & rechecked at goal - 138/84. Patient denies any cardiac symptoms as chest pain, palpitations, shortness of breath, dizziness or ankle swelling.     Patient's hyperlipidemia is controlled with diet and medications. Patient denies myalgias or other medication SE's. Last lipids were at goal: Lab Results  Component Value Date   CHOL 106 01/17/2017   HDL 42 01/17/2017   LDLCALC 63 04/17/2016   TRIG 60 01/17/2017   CHOLHDL 2.5 01/17/2017      Patient has severe Morbid Obesity (BMI 42+) and prediabetes with A1c 6.0% in 2011 and 6.8% in 2012 and since then A1c's have remained less than 6.5%.  Patient denies reactive hypoglycemic symptoms, visual blurring, diabetic polys or paresthesias. Patient admits gluttony with compulsive overeating. Last A1c was not at goal: Lab Results  Component Value Date   HGBA1C 6.0 (H) 01/17/2017       Finally, patient has history of  Vitamin D Deficiency ("44"/2012) and last vitamin D was near goal: Lab Results  Component Value Date   VD25OH 49 01/17/2017   Current Outpatient Medications on File Prior to Visit  Medication Sig  . atorvastatin (LIPITOR) 80 MG tablet Take 1 tablet (80 mg total) by mouth daily.  . budesonide-formoterol (SYMBICORT) 160-4.5 MCG/ACT inhaler Inhale 2 puffs into the lungs 2 (two) times daily.  . Cholecalciferol (VITAMIN D PO) Take 2,000 Units by mouth 2 (two) times daily.  Marland Kitchen diltiazem (CARDIZEM) 30 MG tablet Take 1 tablet every 4 hours AS NEEDED for afib rapid heart rate over 100  . doxazosin (CARDURA) 8 MG tablet Take 1 tablet (8 mg total) by mouth daily. (Patient taking differently: Take 8 mg by mouth at bedtime. )  . metoprolol succinate (TOPROL-XL) 25 MG 24 hr tablet Take 1 tablet (25 mg total) by mouth 2 (two) times daily.  . Multiple Vitamin (MULTIVITAMIN) tablet Take 1 tablet by mouth daily.  . nitroGLYCERIN (NITROSTAT) 0.4 MG SL tablet Place 0.4 mg under the tongue once.  . rivaroxaban (XARELTO) 20 MG TABS tablet Take 20 mg by mouth daily with supper.   . vitamin B-12 (CYANOCOBALAMIN) 1000 MCG tablet Take 1,000 mcg by mouth daily.  . divalproex (DEPAKOTE ER) 500 MG 24 hr tablet Take 1 tablet 2 x / day to prevent seizures (Patient taking differently: Take 500 mg by  mouth 2 (two) times daily. to prevent seizures)  . montelukast (SINGULAIR) 10 MG tablet Take 1 tablet (10 mg total) by mouth daily.   No current facility-administered medications on file prior to visit.    No Known Allergies   Past Medical History:  Diagnosis Date  . A-fib ([place])   . COPD (chronic obstructive pulmonary disease) ([place])   . Depression   . Hyperlipidemia   . Hypertension   . Stroke [facility])    Health Maintenance  Topic Date Due  . Hepatitis C Screening  [birth year]  . TETANUS/TDAP  03/13/2016  . COLONOSCOPY  04/12/2025  . INFLUENZA VACCINE  Completed  . PNA vac Low Risk Adult  Completed   Immunization  History  Administered Date(s) Administered  . Influenza Whole 01/19/2011, 01/22/2012  . Influenza, High Dose Seasonal PF 12/01/2015  . Influenza-Unspecified 12/28/2012, 12/10/2013, 12/10/2016  . Pneumococcal Conjugate-13 10/10/2013  . Pneumococcal-Unspecified 03/12/2009  . Td 03/12/2006  . Tdap 04/29/2017   Last Colon - 05/2006 and most recent at [place] in 2017& was advised 3 yr f/u.   Past Surgical History:  Procedure Laterality Date  . ANKLE SURGERY    . CAROTID ENDARTERECTOMY     06/2010   Family History  Problem Relation Age of Onset  . Ovarian cancer Mother   . Hypertension Mother   . Hypertension Father   . Cancer Father   . Hypertension Brother   . Diabetes Brother   . Diabetes Daughter   . Diabetes Son   . Hypertension Sister    Social History   Socioeconomic History  . Marital status: Widowed, but in a long term monogamous relationship    Spouse name: Animator  Social Needs  Occupational History  . Retired 2001 from [employer] work.  Tobacco Use  . Smoking status: Former Smoker    Last attempt to quit: 05/11/2010    Years since quitting: 6.9  . Smokeless tobacco: Never Used  Substance and Sexual Activity  . Alcohol use: No    Alcohol/week: 0.0 oz    Comment: rarely  . Drug use: No  . Sexual activity: Not on file  Other Topics Concern  . Not on file  Social History Narrative  . Not on file    ROS Constitutional: Denies fever, chills, weight loss/gain, headaches, insomnia,  night sweats or change in appetite. Does c/o fatigue. Eyes: Denies redness, blurred vision, diplopia, discharge, itchy or watery eyes.  ENT: Denies discharge, congestion, post nasal drip, epistaxis, sore throat, earache, hearing loss, dental pain, Tinnitus, Vertigo, Sinus pain or snoring.  Cardio: Denies chest pain, palpitations, irregular heartbeat, syncope, dyspnea, diaphoresis, orthopnea, PND, claudication or edema Respiratory: denies cough, dyspnea, DOE, pleurisy, hoarseness, laryngitis or  wheezing.  Gastrointestinal: Denies dysphagia, heartburn, reflux, water brash, pain, cramps, nausea, vomiting, bloating, diarrhea, constipation, hematemesis, melena, hematochezia, jaundice or hemorrhoids Genitourinary: Denies dysuria, frequency, urgency, nocturia, hesitancy, discharge, hematuria or flank pain Musculoskeletal: Denies arthralgia, myalgia, stiffness, Jt. Swelling, pain, limp or strain/sprain. Denies Falls. Skin: Denies puritis, rash, hives, warts, acne, eczema or change in skin lesion Neuro: No weakness, tremor, incoordination, spasms, paresthesia or pain Psychiatric: Denies confusion, memory loss or sensory loss. Denies Depression. Endocrine: Denies change in weight, skin, hair change, nocturia, and paresthesia, diabetic polys, visual blurring or hyper / hypo glycemic episodes.  Heme/Lymph: No excessive bleeding, bruising or enlarged lymph nodes.  Physical Exam  BP 138/84   Pulse 72   Temp 97.7 F (36.5 C)   Resp 18   Ht 5'  9" (1.753 m)   Wt 293 lb 12.8 oz (133.3 kg)   BMI 43.39 kg/m   General Appearance: Well nourished and well groomed and in no apparent distress.  Eyes: PERRLA, EOMs, conjunctiva no swelling or erythema, normal fundi and vessels. Sinuses: No frontal/maxillary tenderness ENT/Mouth: EACs patent / TMs  nl. Nares clear without erythema, swelling, mucoid exudates. Oral hygiene is good. No erythema, swelling, or exudate. Mallampati II/III. Hearing normal.  Neck: Supple, thyroid not palpable. No bruits, nodes or JVD. Respiratory: Respiratory effort normal.  BS equal and clear bilateral without rales, rhonci, wheezing or stridor. Cardio: Heart sounds are soft with regular rate and rhythm and no murmurs. Peripheral pulses are normal and equal bilaterally without edema. Chest: moderative gibbous deformity with increased AP diameter. .  Abdomen: Soft, with Nl bowel sounds. Nontender, no guarding, rebound, hernias, masses, or organomegaly.  Lymphatics: Non tender  without lymphadenopathy.  Genitourinary: No hernias.Testes nl. DRE - prostate nl for age - smooth & firm w/o nodules. Musculoskeletal: Full ROM all peripheral extremities, joint stability, 5/5 strength, and normal gait. Skin: Warm and dry without rashes, lesions, cyanosis, clubbing or  ecchymosis.  Neuro: Cranial nerves intact, reflexes equal bilaterally. Normal muscle tone, no cerebellar symptoms. Sensation intact.  Pysch: Alert and oriented X 3 with normal affect, insight and judgment appropriate.   Assessment and Plan   1. Essential hypertension  - EKG 12-Lead - [place], RETROPERITNL ABD,  LTD - Urinalysis, Routine w reflex microscopic - Microalbumin / creatinine urine ratio - CBC with Differential/Platelet - BASIC METABOLIC PANEL WITH GFR - Magnesium - TSH  2. Hyperlipidemia, mixed  - EKG 12-Lead - [place], RETROPERITNL ABD,  LTD - Hepatic function panel - Lipid panel - TSH  3. Prediabetes  - EKG 12-Lead - [place], RETROPERITNL ABD,  LTD - Hemoglobin A1c - Insulin, random  4. Vitamin D deficiency  - VITAMIN D 25 Hydroxy   5. Abnormal glucose  - Hemoglobin A1c - Insulin, random  6. Chronic a-fib (HCC)  - EKG 12-Lead - TSH  7. ASCVD (arteriosclerotic cardiovascular disease)  - EKG 12-Lead  8. Seizure disorder (HCC)  - Valproic acid level  9. Chronic obstructive pulmonary disease ([place])   10. Class 3 severe obesity due to excess calories with serious comorbidity and body mass index (BMI) of 40.0 to 44.9 in adult ([place])   11. OSA on CPAP  12. Prostate cancer screening  - PSA  13. Benign localized prostatic hyperplasia with lower urinary tract symptoms (LUTS)  - PSA  14. Depression, controlled  - sertraline (ZOLOFT) 100 MG tablet; Take 1 tablet daily for Mood & Irritability  Dispense: 90 tablet; Refill: 3  15. Encounter for colorectal cancer screening  - POC Hemoccult Bld/Stl  16. Screening for AAA (aortic abdominal aneurysm)  - [place], RETROPERITNL ABD,   LTD  17. Screening for ischemic heart disease  - EKG 12-Lead  18. Former smoker  - EKG 12-Lead - [place], RETROPERITNL ABD,  LTD  19. Family history of ischemic heart disease  - EKG 12-Lead - [place], RETROPERITNL ABD,  LTD  20. Medication management  - Urinalysis, Routine w reflex microscopic - Microalbumin / creatinine urine ratio - Valproic acid level  21. Morbid obesity (HCC)  - phentermine (ADIPEX-P) 37.5 MG tablet; Take 1/2 to 1 tablet every morning for Dieting &  Weight Loss  Dispense: 30 tablet; Refill: 5  22. Need for prophylactic vaccination with combined diphtheria-tetanus-pertussis (DTP) vaccine  - Tdap vaccine greater than or equal to 7yo  IM       Patient was counseled in prudent diet, weight control to achieve/maintain BMI less than 25, BP monitoring, regular exercise and medications as discussed.  Discussed med effects and SE's. Routine screening labs and tests as requested with regular follow-up as recommended. Over 40 minutes of exam, counseling, chart review and high complex critical decision making was performed

## 2017-04-30 LAB — BASIC METABOLIC PANEL WITH GFR
BUN: 16 mg/dL (ref 7–25)
CHLORIDE: 103 mmol/L (ref 98–110)
CO2: 33 mmol/L — ABNORMAL HIGH (ref 20–32)
CREATININE: 0.87 mg/dL (ref 0.70–1.18)
Calcium: 9.3 mg/dL (ref 8.6–10.3)
GFR, EST AFRICAN AMERICAN: 99 mL/min/{1.73_m2} (ref >=60)
GFR, Est Non African American: 86 mL/min/{1.73_m2} (ref >=60)
Glucose, Bld: 98 mg/dL (ref 65–99)
Potassium: 4.3 mmol/L (ref 3.5–5.3)
Sodium: 141 mmol/L (ref 135–146)

## 2017-04-30 LAB — CBC WITH DIFFERENTIAL/PLATELET
BASOS PCT: 1.2 %
Basophils Absolute: 62 cells/uL (ref 0–200)
EOS ABS: 140 {cells}/uL (ref 15–500)
Eosinophils Relative: 2.7 %
HCT: 41.2 % (ref 38.5–50.0)
HEMOGLOBIN: 14.1 g/dL (ref 13.2–17.1)
Lymphs Abs: 1331 cells/uL (ref 850–3900)
MCH: 29.9 pg (ref 27.0–33.0)
MCHC: 34.2 g/dL (ref 32.0–36.0)
MCV: 87.3 fL (ref 80.0–100.0)
MPV: 10.4 fL (ref 7.5–12.5)
Monocytes Relative: 9 %
Neutro Abs: 3198 cells/uL (ref 1500–7800)
Neutrophils Relative %: 61.5 %
PLATELETS: 256 10*3/uL (ref 140–400)
RBC: 4.72 10*6/uL (ref 4.20–5.80)
RDW: 13.6 % (ref 11.0–15.0)
TOTAL LYMPHOCYTE: 25.6 %
WBC: 5.2 10*3/uL (ref 3.8–10.8)
WBCMIX: 468 {cells}/uL (ref 200–950)

## 2017-04-30 LAB — URINALYSIS, ROUTINE W REFLEX MICROSCOPIC
BILIRUBIN URINE: NEGATIVE
GLUCOSE, UA: NEGATIVE
HGB URINE DIPSTICK: NEGATIVE
KETONES UR: NEGATIVE
Leukocytes, UA: NEGATIVE
Nitrite: NEGATIVE
PROTEIN: NEGATIVE
Specific Gravity, Urine: 1.009 (ref 1.001–1.03)
pH: 7 (ref 5.0–8.0)

## 2017-04-30 LAB — LIPID PANEL
CHOLESTEROL: 137 mg/dL (ref <200)
HDL: 46 mg/dL (ref >40)
LDL CHOLESTEROL (CALC): 75 mg/dL
Non-HDL Cholesterol (Calc): 91 mg/dL (calc) (ref <130)
TRIGLYCERIDES: 82 mg/dL (ref <150)
Total CHOL/HDL Ratio: 3 (calc) (ref <5.0)

## 2017-04-30 LAB — HEMOGLOBIN A1C
HEMOGLOBIN A1C: 6.1 %{Hb} — AB (ref <5.7)
Mean Plasma Glucose: 128 (calc)
eAG (mmol/L): 7.1 (calc)

## 2017-04-30 LAB — MICROALBUMIN / CREATININE URINE RATIO
CREATININE, URINE: 54 mg/dL (ref 20–320)
Microalb Creat Ratio: 4 mcg/mg creat (ref <30)
Microalb, Ur: 0.2 mg/dL

## 2017-04-30 LAB — HEPATIC FUNCTION PANEL
AG Ratio: 2 (calc) (ref 1.0–2.5)
ALBUMIN MSPROF: 4.3 g/dL (ref 3.6–5.1)
ALT: 17 U/L (ref 9–46)
AST: 12 U/L (ref 10–35)
Alkaline phosphatase (APISO): 39 U/L — ABNORMAL LOW (ref 40–115)
BILIRUBIN DIRECT: 0.1 mg/dL (ref 0.0–0.2)
GLOBULIN: 2.1 g/dL (ref 1.9–3.7)
Indirect Bilirubin: 0.3 mg/dL (calc) (ref 0.2–1.2)
Total Bilirubin: 0.4 mg/dL (ref 0.2–1.2)
Total Protein: 6.4 g/dL (ref 6.1–8.1)

## 2017-04-30 LAB — VITAMIN D 25 HYDROXY (VIT D DEFICIENCY, FRACTURES): VIT D 25 HYDROXY: 52 ng/mL (ref 30–100)

## 2017-04-30 LAB — MAGNESIUM: MAGNESIUM: 1.9 mg/dL (ref 1.5–2.5)

## 2017-04-30 LAB — PSA: PSA: 0.6 ng/mL (ref <=4.0)

## 2017-04-30 LAB — INSULIN, RANDOM: Insulin: 12.2 u[IU]/mL (ref 2.0–19.6)

## 2017-04-30 LAB — TSH: TSH: 1.24 mIU/L (ref 0.40–4.50)

## 2017-08-12 ENCOUNTER — Ambulatory Visit: Payer: Self-pay | Admitting: Adult Health

## 2017-11-18 ENCOUNTER — Ambulatory Visit: Payer: Self-pay | Admitting: Internal Medicine

## 2017-11-29 ENCOUNTER — Other Ambulatory Visit: Payer: Self-pay | Admitting: Physician Assistant

## 2017-11-29 MED ORDER — PREDNISONE 20 MG PO TABS: ORAL_TABLET | ORAL | 0 refills | Status: DC

## 2017-11-29 MED ORDER — ALBUTEROL SULFATE HFA 108 (90 BASE) MCG/ACT IN AERS: 2.0000 | INHALATION_SPRAY | RESPIRATORY_TRACT | 0 refills | Status: AC | PRN

## 2018-02-03 ENCOUNTER — Other Ambulatory Visit: Payer: Self-pay | Admitting: Physician Assistant

## 2018-02-03 MED ORDER — AZITHROMYCIN 250 MG PO TABS: ORAL_TABLET | ORAL | 1 refills | Status: AC

## 2018-02-03 MED ORDER — PREDNISONE 20 MG PO TABS: ORAL_TABLET | ORAL | 0 refills | Status: DC

## 2018-05-26 ENCOUNTER — Encounter: Payer: Self-pay | Admitting: Internal Medicine

## 2018-10-09 ENCOUNTER — Other Ambulatory Visit: Payer: Self-pay

## 2019-02-02 ENCOUNTER — Other Ambulatory Visit: Payer: Self-pay | Admitting: Physician Assistant

## 2019-02-02 NOTE — Progress Notes (Signed)
Future Appointments  Date Time Provider [place]  06/16/2019  2:00 PM Unk Pinto, MD [id number] None

## 2019-03-03 IMAGING — NM NM MISC PROCEDURE
3 series · 18 of 18 positions shown · non-contrast
Comparison: none

[Series 1: stress-gsp_(id)_sa · 6.4mm · 6.40mm/px · 6 of 512 frames shown]
[frame 43/512]
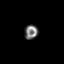
[frame 128/512]
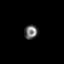
[frame 214/512]
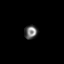
[frame 299/512]
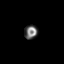
[frame 384/512]
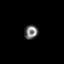
[frame 470/512]
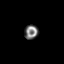

[Series 1: rest_(id)_sa · 6.4mm · 6.40mm/px · 6 of 64 frames shown]
[frame 6/64]
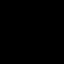
[frame 16/64]
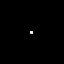
[frame 27/64]
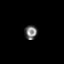
[frame 38/64]
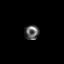
[frame 48/64]
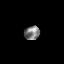
[frame 59/64]
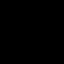

[Series 1: stress-sum-em_(id)_sa · 6.4mm · 6.40mm/px · 6 of 64 frames shown]
[frame 6/64]
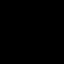
[frame 16/64]
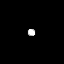
[frame 27/64]
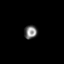
[frame 38/64]
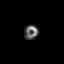
[frame 48/64]
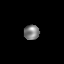
[frame 59/64]
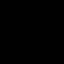

[18 of 18 positions shown; findings below may reference images not displayed]

Canned report from images found in remote index.

Refer to host system for actual result text.

## 2019-06-15 ENCOUNTER — Encounter: Payer: Self-pay | Admitting: Internal Medicine

## 2019-06-15 DIAGNOSIS — E785 Hyperlipidemia, unspecified: Secondary | ICD-10-CM | POA: Insufficient documentation

## 2019-06-15 DIAGNOSIS — E1122 Type 2 diabetes mellitus with diabetic chronic kidney disease: Secondary | ICD-10-CM | POA: Insufficient documentation

## 2019-06-15 DIAGNOSIS — Z8249 Family history of ischemic heart disease and other diseases of the circulatory system: Secondary | ICD-10-CM | POA: Insufficient documentation

## 2019-06-15 DIAGNOSIS — E1169 Type 2 diabetes mellitus with other specified complication: Secondary | ICD-10-CM | POA: Insufficient documentation

## 2019-06-15 DIAGNOSIS — Z87891 Personal history of nicotine dependence: Secondary | ICD-10-CM | POA: Insufficient documentation

## 2019-06-15 NOTE — Progress Notes (Signed)
R  E  S  C  H  E  D  U  L  E D                                                                                                                                                                                                                                            This very nice 74 y.o. DWM presents for a Screening /Preventative Visit & comprehensive evaluation and management of multiple medical co-morbidities.  Patient has been followed for HTN,  ASHD/Afib, ASCVD/CVA's,  HLD, Seizure Disorder, T2_NIDDM  and Vitamin D Deficiency. Patient has OSA on CPAP and endorses improved restorative sleep.  Patient also is on Symbicort for COPD. Patient is followed regularly at the [facility] clinics and was last seen in this office 2 years ago in Feb 2019 .      HTN predates circa 1997. Patient's BP has been controlled at home.  Today's  .  In 2012, patient had a non focal CVA and then in 2013 developed a focal seizures attributed to the CVA.  Also in 2013, he had a negative stress Myoview in 2013 at the [place]. In 2015, he presented with Afib & was started on Xarelto. Patient denies any cardiac symptoms as chest pain, palpitations, shortness of breath, dizziness or ankle swelling.     Patient's hyperlipidemia is controlled with diet and Atorvastatin. Patient denies myalgias or other medication SE's. Last lipids were at goal:  Lab Results  Component Value Date   CHOL 137 04/29/2017   HDL 46 04/29/2017   LDLCALC 75 04/29/2017   TRIG 82 04/29/2017   CHOLHDL 3.0 04/29/2017       Patient has severe Morbid Obesity (BMI 42+) and consequent prediabetes then T2_DM  (A1c 6.0% / 2011 and 6.8% / 2012) and with CKD2 (GFR 86) and he has controlled hid A1c's < 6.5% by diet.  Patient does endorse  compulsive overeating.  Patient denies reactive hypoglycemic symptoms, visual blurring, diabetic polys or paresthesias. Last A1c was not at goal:  Lab Results  Component Value Date   HGBA1C 6.1 (H) 04/29/2017        Finally, patient has history of Vitamin D Deficiency  ("44" / 2012)   and last vitamin D was still slightly low:  Lab Results  Component Value Date   VD25OH 52 04/29/2017

## 2019-06-16 ENCOUNTER — Ambulatory Visit: Payer: Medicare Other | Admitting: Internal Medicine

## 2019-06-16 DIAGNOSIS — I679 Cerebrovascular disease, unspecified: Secondary | ICD-10-CM | POA: Insufficient documentation

## 2019-06-16 DIAGNOSIS — Z8673 Personal history of transient ischemic attack (TIA), and cerebral infarction without residual deficits: Secondary | ICD-10-CM | POA: Insufficient documentation

## 2019-06-20 ENCOUNTER — Other Ambulatory Visit: Payer: Self-pay | Admitting: Internal Medicine

## 2019-06-20 MED ORDER — DILTIAZEM HCL 30 MG PO TABS: ORAL_TABLET | ORAL | 0 refills | Status: AC

## 2019-08-17 ENCOUNTER — Other Ambulatory Visit: Payer: Self-pay | Admitting: Physician Assistant

## 2019-08-17 MED ORDER — METOPROLOL SUCCINATE ER 25 MG PO TB24: 25.0000 mg | ORAL_TABLET | Freq: Two times a day (BID) | ORAL | 2 refills | Status: AC

## 2019-08-31 NOTE — Progress Notes (Signed)
Patient ID: George Delgado, male   DOB: 1946-01-23, 74 y.o.   MRN: [id number]  MEDICARE ANNUAL WELLNESS VISIT AND OV  Assessment:    Essential hypertension - continue medications, DASH diet, exercise and monitor at home. Call if greater than 130/80.  -     CBC with Differential/Platelet -     BASIC METABOLIC PANEL WITH GFR -     Hepatic function panel -     TSH  ASCVD (arteriosclerotic cardiovascular disease) Control blood pressure, cholesterol, glucose, increase exercise.  Chronic a-fib (HCC)  continue rate control medication xarelto  OSA  Weight loss advised, continue CPAP use but add in the O2  Generalized convulsive epilepsy (HCC) Check depakote   BPH (benign prostatic hyperplasia) Continue doxazosin  Hyperlipidemia -continue medications, check lipids, decrease fatty foods, increase activity.   Prediabetes Discussed general issues about diabetes pathophysiology and management., Educational material distributed., Suggested low cholesterol diet., Encouraged aerobic exercise., Discussed foot care., Reminded to get yearly retinal exam. With weight loss do not need to check- fasting sugars have been below 100 at [facility] hospital  Vitamin D deficiency  Morbid obesity, unspecified obesity type ([place]) Obesity with co morbidities- long discussion about weight loss, diet, and exercise If any worsening palps/HR will stop, has tolerated in the past -     phentermine (ADIPEX-P) 37.5 MG tablet; Take 1 tablet (37.5 mg total) by mouth daily before breakfast.  Recurrent major depressive disorder, in partial remission ([place]) Worsening with diagnosis of little brother Will stop zoloft, try low dose celexa 10 mg Stop singulair- to see if this improves it.   Encounter for Medicare annual wellness exam 1 year  Bladder neck obstruction Continue doxazosin  Chronic obstructive pulmonary disease, unspecified COPD type ([place]) Had [facility] chest at [facility] hospital last year per patient Continue symbicort  daily    Plan:   During the course of the visit the patient was educated and counseled about appropriate screening and preventive services including:    Pneumococcal vaccine   Influenza vaccine  Td vaccine  Screening electrocardiogram  Bone densitometry screening  Colorectal cancer screening  Diabetes screening  Glaucoma screening  Nutrition counseling   Advanced directives: requested  Subjective:   George Delgado  presents for Medicare Annual Wellness Visit and complete physical.  Date of last medicare wellness visit was 05/01/2013. This very nice 74 y.o. MWM presents for 3 month follow up with Hypertension, Hyperlipidemia, Pre-Diabetes and Vitamin D Deficiency.   Patient is treated for HTN & BP has been controlled at home. Today's BP: 134/70. He has been walking in the mornings.   He has a history of CVA s/p left CEA. He is on  for seizures due to the CVA, he continues to have some memory and speech issues. He is on depakote for seziures/mood  BMI is Body mass index is 38.99 kg/m., he is working on diet and exercise. Wt Readings from Last 3 Encounters:  09/02/19 264 lb (119.7 kg)  04/29/17 293 lb 12.8 oz (133.3 kg)  01/25/17 295 lb (133.8 kg)    He has history of Afib with RVR, his rate is controlled and he is on Xarelto, follows with cardiology at the [facility] hospital. Normal Stress test 2018.  Patient has had no complaints of any cardiac type chest pain, palpitations, dyspnea/orthopnea/PND, dizziness, claudication. A week ago he was waxing his new car and had palpitations and dizziness, took pill in the pocket diltiazem and this helped. Will happen once every 2-3 weeks.  He does have  dependent edema, but elevating or walking helps.  He has COPD, some baseline shortness of breath and coughing, has had pulmonary function test, had CT scan last year with [facility] hospital per patient. He has over 30 pack year smoking history and quit 9 years ago. He is on symbicort and  singulair  He has OSA and has a CPAP WITH OXYGEN but states just using the CPAP- not the oxygen.   He has been more depressed with his little brother being diagnosed with terminal brain cancer. He is on singulair x 3 years, he is on depakote for seizures and he was on zoloft but has not been taking it. States it does not help   Hyperlipidemia is controlled with diet & meds, he is on 40 mg of the lipitor daily, 1/2 of the 80. Patient denies myalgias or other med SE's.  Lab Results  Component Value Date   CHOL 137 04/29/2017   HDL 46 04/29/2017   LDLCALC 75 04/29/2017   TRIG 82 04/29/2017   CHOLHDL 3.0 04/29/2017   Also, the patient has history of PreDiabetes and has had no symptoms of reactive hypoglycemia, diabetic polys, paresthesias or visual blurring.   Lab Results  Component Value Date   HGBA1C 6.1 (H) 04/29/2017   Further, the patient also has history of Vitamin D Deficiency and supplements vitamin D without any suspected side-effects. Lab Results  Component Value Date   VD25OH 52 04/29/2017   He has BPH symptoms, is on doxazosin.   He has chronic pain, has kyphosis with his back. Has lower back pain with left leg weakness/tingling occ. Neck pain at night mainly.    Medication Review:   Current Outpatient Medications (Cardiovascular):  .  atorvastatin (LIPITOR) 80 MG tablet, Take 1 tablet (80 mg total) by mouth daily. Marland Kitchen  diltiazem (CARDIZEM) 30 MG tablet, Take 1 tablet every 4 hours AS NEEDED for Afib rapid heart rate over 100 .  doxazosin (CARDURA) 8 MG tablet, Take 1 tablet (8 mg total) by mouth daily. (Patient taking differently: Take 8 mg by mouth at bedtime. ) .  metoprolol succinate (TOPROL-XL) 25 MG 24 hr tablet, Take 1 tablet (25 mg total) by mouth 2 (two) times daily. .  nitroGLYCERIN (NITROSTAT) 0.4 MG SL tablet, Place 0.4 mg under the tongue once.  Current Outpatient Medications (Respiratory):  .  albuterol (VENTOLIN HFA) 108 (90 Base) MCG/ACT inhaler, Inhale 2  puffs into the lungs every 4 (four) hours as needed for wheezing or shortness of breath. .  budesonide-formoterol (SYMBICORT) 160-4.5 MCG/ACT inhaler, Inhale 2 puffs into the lungs 2 (two) times daily.   Current Outpatient Medications (Hematological):  .  rivaroxaban (XARELTO) 20 MG TABS tablet, Take 20 mg by mouth daily with supper.  .  vitamin B-12 (CYANOCOBALAMIN) 1000 MCG tablet, Take 1,000 mcg by mouth daily.  Current Outpatient Medications (Other):  Marland Kitchen  Cholecalciferol (VITAMIN D PO), Take 2,000 Units by mouth 2 (two) times daily. .  Multiple Vitamin (MULTIVITAMIN) tablet, Take 1 tablet by mouth daily. .  phentermine (ADIPEX-P) 37.5 MG tablet, Take 1/2 to 1 tablet every morning for Dieting &  Weight Loss .  divalproex (DEPAKOTE ER) 500 MG 24 hr tablet, Take 1 tablet 2 x / day to prevent seizures (Patient taking differently: Take 500 mg by mouth 2 (two) times daily. to prevent seizures)  Current Problems (verified) Patient Active Problem List   Diagnosis Date Noted  . Chronic obstructive pulmonary disease ([place]) 09/02/2019  . Cerebrovascular disease 06/16/2019  .  History of stroke 06/16/2019  . Type 2 diabetes mellitus with stage 2 chronic kidney disease, without long-term current use of insulin ([place]) 06/15/2019  . Hyperlipidemia associated with type 2 diabetes mellitus ([place]) 06/15/2019  . FH: hypertension 06/15/2019  . Former smoker 06/15/2019  . Seizures ([place]) 03/14/2016  . Encounter for Medicare annual wellness exam 02/22/2015  . Major depression in partial remission ([place]) 08/18/2014  . Chronic a-fib ([place]) 08/18/2014  . BPH with obstruction/lower urinary tract symptoms 08/18/2014  . Medication management 07/07/2013  . Vitamin D deficiency 04/30/2013  . Class 3 severe obesity due to excess calories with serious comorbidity and body mass index (BMI) of 40.0 to 44.9 in adult ([place]) 04/30/2013  . Generalized convulsive epilepsy ([place]) 01/29/2013  . OSA on CPAP 01/29/2013  .  Essential hypertension 01/28/2013  . ASCVD (arteriosclerotic cardiovascular disease) 01/28/2013  . Hyperlipidemia 01/28/2013  . Prediabetes 01/28/2013   Screening Tests Immunization History  Administered Date(s) Administered  . Influenza Whole 01/19/2011, 01/22/2012  . Influenza, High Dose Seasonal PF 12/01/2015  . Influenza-Unspecified 12/28/2012, 12/10/2013, 12/10/2016  . Moderna SARS-COVID-2 Vaccination 04/20/2019, 05/18/2019  . Pneumococcal Conjugate-13 10/10/2013  . Pneumococcal-Unspecified 03/12/2009  . Td 03/12/2006  . Tdap 04/29/2017   Preventative care: Last colonoscopy: 12/2013  FIT test at [facility] hospital yearly Ct chest at the [place] of Other Physician/Practitioners you currently use: 1. [facility] here for primary care  at the [facility] hospital, follows with eye doctor there,  last visit 04/2015 Follows with cardiologist, PCP, and GI at the [facility] hospital.  Patient Care Team: Unk Pinto, MD as PCP - General (Internal Medicine)  Allergies No Known Allergies  SURGICAL HISTORY He  has a past surgical history that includes Ankle surgery and Carotid endarterectomy. FAMILY HISTORY His family history includes Cancer in his father; Diabetes in his brother, daughter, and son; Hypertension in his brother, father, mother, and sister; Ovarian cancer in his mother. SOCIAL HISTORY He  reports that he quit smoking about 9 years ago. He has never used smokeless tobacco. He reports that he does not drink alcohol and does not use drugs.  MEDICARE WELLNESS OBJECTIVES: Physical activity:   Cardiac risk factors:   Depression/mood screen:   Depression screen [facility] 2/9 04/29/2017  Decreased Interest 0  Down, Depressed, Hopeless 0  PHQ - 2 Score 0    ADLs:  No flowsheet data found.   Cognitive Testing  Alert? Yes  Normal Appearance?Yes  Oriented to person? Yes  Place? Yes   Time? Yes  Recall of three objects?  Yes  Can perform simple  calculations? Yes  Displays appropriate judgment?Yes  Can read the correct time from a watch face?Yes  EOL planning:     Review of Systems  Constitutional: Positive for malaise/fatigue (with exertion). Negative for chills, diaphoresis, fever and weight loss.  Eyes: Negative.  Negative for blurred vision and double vision.  Respiratory: Positive for shortness of breath (with exertion occ) and wheezing (better). Negative for cough, hemoptysis and sputum production.   Cardiovascular: Negative for chest pain, palpitations, orthopnea, claudication, leg swelling and PND.  Gastrointestinal: Negative.  Negative for abdominal pain, blood in stool, constipation, diarrhea, heartburn, melena and nausea.  Genitourinary: Negative for dysuria, flank pain, frequency, hematuria and urgency.       Nocturia x 1  Musculoskeletal: Positive for back pain, joint pain (left knee pain worse), myalgias (with walking) and neck pain. Negative for falls.  Skin: Negative.   Neurological: Negative.  Negative  for dizziness, seizures and weakness.  Psychiatric/Behavioral: Positive for memory loss. Negative for depression, hallucinations, substance abuse and suicidal ideas. The patient is not nervous/anxious and does not have insomnia.      Objective:     BP 134/70   Pulse 78   Temp (!) 97.4 F (36.3 C)   Ht 5\' 9"  (1.753 m)   Wt 264 lb (119.7 kg)   SpO2 98%   BMI 38.99 kg/m   General Appearance:  Alert  WD/WN, male  in no apparent distress. Eyes: PERRLA, EOMs nl, conjunctiva erythematous without discharge, soft globes Sinuses: No frontal/maxillary tenderness ENT/Mouth: EACs patent / TMs  nl. Nares clear without erythema, swelling, mucoid exudates. Oral hygiene is good. No erythema, swelling, or exudate. Tongue normal, non-obstructing. Tonsils not swollen or erythematous. Hearing decreased.  Neck: Supple, thyroid normal. No bruits, nodes or JVD. Well healed scar on over left carotid.  Respiratory: Respiratory  effort normal.  BS equal and clear bilateral without rales, rhonci, wheezing or stridor. Cardio: Heart sounds are normal with normal rate and  regular rhythm with high pitched 3/6 systolic murmur radiation to carotids without rubs or gallops. Decreased hair bilateral legs, decreased TP/DP bilaterally without edema. No aortic or femoral bruits. Chest: symmetric with normal excursions and percussion.  Abdomen: Obese, soft, with nl bowel sounds. Nontender, no guarding, rebound, hernias, masses, or organomegaly.  Lymphatics: Non tender without lymphadenopathy.  Genitourinary: No hernias. Musculoskeletal: Cervical kyphosis. Full ROM all peripheral extremities, joint stability, 5/5 strength, and antalgic gait. Skin: Warm and dry without rashes, lesions, cyanosis, clubbing or  ecchymosis.  Neuro: Cranial nerves intact, reflexes equal bilaterally. Normal muscle tone, no cerebellar symptoms. Sensation intact.  Pysch: Alert and oriented X 3 with normal affect, insight and judgment appropriate.    Medicare Attestation I have personally reviewed: The patient's medical and social history Their use of alcohol, tobacco or illicit drugs Their current medications and supplements The patient's functional ability including ADLs,fall risks, home safety risks, cognitive, and hearing and visual impairment Diet and physical activities Evidence for depression or mood disorders  The patient's weight, height, BMI, and visual acuity have been recorded in the chart.  I have made referrals, counseling, and provided education to the patient based on review of the above and I have provided the patient with a written personalized care plan for preventive services.  Over 40 minutes of exam, counseling, chart review was performed.  Vicie Mutters, PA-C   09/02/2019

## 2019-09-02 ENCOUNTER — Ambulatory Visit (INDEPENDENT_AMBULATORY_CARE_PROVIDER_SITE_OTHER): Payer: Medicare Other | Admitting: Physician Assistant

## 2019-09-02 ENCOUNTER — Encounter: Payer: Self-pay | Admitting: Physician Assistant

## 2019-09-02 ENCOUNTER — Other Ambulatory Visit: Payer: Self-pay

## 2019-09-02 VITALS — BP 134/70 | HR 78 | Temp 97.4°F | Ht 69.0 in | Wt 264.0 lb

## 2019-09-02 DIAGNOSIS — J449 Chronic obstructive pulmonary disease, unspecified: Secondary | ICD-10-CM

## 2019-09-02 DIAGNOSIS — I482 Chronic atrial fibrillation, unspecified: Secondary | ICD-10-CM | POA: Diagnosis not present

## 2019-09-02 DIAGNOSIS — I2510 Atherosclerotic heart disease of native coronary artery without angina pectoris: Secondary | ICD-10-CM

## 2019-09-02 DIAGNOSIS — I10 Essential (primary) hypertension: Secondary | ICD-10-CM

## 2019-09-02 DIAGNOSIS — R6889 Other general symptoms and signs: Secondary | ICD-10-CM

## 2019-09-02 DIAGNOSIS — F3341 Major depressive disorder, recurrent, in partial remission: Secondary | ICD-10-CM

## 2019-09-02 DIAGNOSIS — Z0001 Encounter for general adult medical examination with abnormal findings: Secondary | ICD-10-CM

## 2019-09-02 DIAGNOSIS — Z0000 Encounter for general adult medical examination without abnormal findings: Secondary | ICD-10-CM

## 2019-09-02 DIAGNOSIS — Z136 Encounter for screening for cardiovascular disorders: Secondary | ICD-10-CM

## 2019-09-02 MED ORDER — PHENTERMINE HCL 37.5 MG PO TABS: ORAL_TABLET | ORAL | 5 refills | Status: AC

## 2019-09-02 MED ORDER — CITALOPRAM HYDROBROMIDE 10 MG PO TABS: 10.0000 mg | ORAL_TABLET | Freq: Every day | ORAL | 1 refills | Status: AC

## 2019-09-02 NOTE — Patient Instructions (Addendum)
Stop the singulair or moneclast inhibitor- this is to prevent coughing/asthma but this can worsen depression. Stop it for now and see if depression gets better, if not we can restart it if we see a difference.    Stop the zoloft and start the celexa 10 mg a day for 6 weeks and see if this helps with depression/mood  MAY WANT TO REPEAT ECHO [place]- WORSENING MURMUR WITH THE SWELLING IN HER LEGS- HAD MILD AS IN 2018- MAY NEED TO REPEAT ECHOCARDIOGRAM  Phentermine  IF YOU GET MORE DIZZINESS OR FAST HEART RATES STOP THIS MEDICATION INCREASE WATER    It is helpful if you bring in a food diary or use an app on your phone such as myfitnesspal to record your calorie intake, especially in the beginning.  After that first initial visit, we will want to see you once a month for accountability.  In addition we can help answer your questions about diet, exercise, and help you every step of the way with your weight loss journey.  HOW TO START THE MEDICATION You can start out on 1/2 a pill in the morning  FOR THE FIRST MONTH.  WE WILL THEN INCREASE TO 1 PILL AFTER THAT 1ST VISIT.   COST OF THE MEDICATION This medication is cheapest CASH pay at [facility] is 14-17 dollars and you do NOT need a membership to get meds from there.   SIDE EFFECTS It causes dry mouth and constipation in almost every patient, so try to get 80-100 oz of water a day and increase fiber such as veggies. You can add on a stool softener if you would like.   It can give you energy however it can also cause some people to be shaky, anxious or have palpitations. Stop this medication if that happens and contact the office.   If this medication does not work for you there are several medications that we can try to help rewire your brain in addition to making healthier habits.   What is this medicine? PHENTERMINE (FEN ter meen) decreases your appetite. This medicine is intended to be used in addition  to a healthy reduced calorie diet and exercise. The best results are achieved this way. This medicine is only indicated for short-term use. Eventually your weight loss may level out and the medication will no longer be needed.   How should I use this medicine? Take this medicine by mouth. Follow the directions on the prescription label. The tablets should stay in the bottle until immediately before you take your dose. Take your doses at regular intervals. Do not take your medicine more often than directed.  Overdosage: If you think you have taken too much of this medicine contact a poison control center or emergency room at once. NOTE: This medicine is only for you. Do not share this medicine with others.  What if I miss a dose? If you miss a dose, take it as soon as you can. If it is almost time for your next dose, take only that dose. Do not take double or extra doses. Do not increase or in any way change your dose without consulting your doctor.  What should I watch for while using this medicine? Notify your physician immediately if you become short of breath while doing your normal activities. Do not take this medicine within 6 hours of bedtime. It can keep you from getting to sleep. Avoid drinks that contain caffeine and try to stick  to a regular bedtime every night. Do not stand or sit up quickly, especially if you are an older patient. This reduces the risk of dizzy or fainting spells. Avoid alcoholic drinks.  What side effects may I notice from receiving this medicine? Side effects that you should report to your doctor or health care professional as soon as possible: -chest pain, palpitations -depression or severe changes in mood -increased blood pressure -irritability -nervousness or restlessness -severe dizziness -shortness of breath -problems urinating -unusual swelling of the legs -vomiting  Side effects that usually do not require medical attention (report to your doctor or  health care professional if they continue or are bothersome): -blurred vision or other eye problems -changes in sexual ability or desire -constipation or diarrhea -difficulty sleeping -dry mouth or unpleasant taste -headache -nausea This list may not describe all possible side effects. Call your doctor for medical advice about side effects. You may report side effects to FDA at 1-800-FDA-1088. General eating tips  What to Avoid . Avoid added sugars o Often added sugar can be found in processed foods such as many condiments, dry cereals, cakes, cookies, chips, crisps, crackers, candies, sweetened drinks, etc.  o Read labels and AVOID/DECREASE use of foods with the following in their ingredient list: Sugar, fructose, high fructose corn syrup, sucrose, glucose, maltose, dextrose, molasses, cane sugar, brown sugar, any type of syrup, agave nectar, etc.   . Avoid snacking in between meals- drink water or if you feel you need a snack, pick a high water content snack such as cucumbers, watermelon, or any veggie.  Marland Kitchen Avoid foods made with flour o If you are going to eat food made with flour, choose those made with whole-grains; and, minimize your consumption as much as is tolerable . Avoid processed foods o These foods are generally stocked in the middle of the grocery store.  o Focus on shopping on the perimeter of the grocery.  What to Include . Vegetables o GREEN LEAFY VEGETABLES: Kale, spinach, mustard greens, collard greens, cabbage, broccoli, etc. o OTHER: Asparagus, cauliflower, eggplant, carrots, peas, Brussel sprouts, tomatoes, bell peppers, zucchini, beets, cucumbers, etc. . Grains, seeds, and legumes o Beans: kidney beans, black eyed peas, garbanzo beans, black beans, pinto beans, etc. o Whole, unrefined grains: brown rice, barley, bulgur, oatmeal, etc. . Healthy fats  o Avoid highly processed fats such as vegetable oil o Examples of healthy fats: avocado, olives, virgin olive oil,  dark chocolate (?72% Cocoa), nuts (peanuts, almonds, walnuts, cashews, pecans, etc.) o Please still do small amount of these healthy fats, they are dense in calories.  . Low - Moderate Intake of Animal Sources of Protein o Meat sources: chicken, [place], salmon, tuna. Limit to 4 ounces of meat at one time or the size of your palm. o Consider limiting dairy sources, but when choosing dairy focus on: PLAIN [place] yogurt, cottage cheese, high-protein milk . Fruit o Choose berries

## 2019-12-24 DIAGNOSIS — Z23 Encounter for immunization: Secondary | ICD-10-CM | POA: Diagnosis not present

## 2020-06-20 NOTE — Progress Notes (Signed)
NO SHOW

## 2020-06-21 ENCOUNTER — Ambulatory Visit: Payer: Medicare Other | Admitting: Internal Medicine

## 2020-06-21 DIAGNOSIS — I482 Chronic atrial fibrillation, unspecified: Secondary | ICD-10-CM

## 2020-06-21 DIAGNOSIS — N138 Other obstructive and reflux uropathy: Secondary | ICD-10-CM

## 2020-06-21 DIAGNOSIS — I679 Cerebrovascular disease, unspecified: Secondary | ICD-10-CM

## 2020-06-21 DIAGNOSIS — Z79899 Other long term (current) drug therapy: Secondary | ICD-10-CM

## 2020-06-21 DIAGNOSIS — F3341 Major depressive disorder, recurrent, in partial remission: Secondary | ICD-10-CM

## 2020-06-21 DIAGNOSIS — R569 Unspecified convulsions: Secondary | ICD-10-CM

## 2020-06-21 DIAGNOSIS — G4733 Obstructive sleep apnea (adult) (pediatric): Secondary | ICD-10-CM

## 2020-06-21 DIAGNOSIS — Z136 Encounter for screening for cardiovascular disorders: Secondary | ICD-10-CM

## 2020-06-21 DIAGNOSIS — J449 Chronic obstructive pulmonary disease, unspecified: Secondary | ICD-10-CM

## 2020-06-21 DIAGNOSIS — E559 Vitamin D deficiency, unspecified: Secondary | ICD-10-CM

## 2020-06-21 DIAGNOSIS — E1122 Type 2 diabetes mellitus with diabetic chronic kidney disease: Secondary | ICD-10-CM

## 2020-06-21 DIAGNOSIS — Z8249 Family history of ischemic heart disease and other diseases of the circulatory system: Secondary | ICD-10-CM

## 2020-06-21 DIAGNOSIS — I10 Essential (primary) hypertension: Secondary | ICD-10-CM

## 2020-06-21 DIAGNOSIS — E1169 Type 2 diabetes mellitus with other specified complication: Secondary | ICD-10-CM

## 2020-06-21 DIAGNOSIS — Z1211 Encounter for screening for malignant neoplasm of colon: Secondary | ICD-10-CM

## 2020-06-21 DIAGNOSIS — I2510 Atherosclerotic heart disease of native coronary artery without angina pectoris: Secondary | ICD-10-CM

## 2020-06-21 DIAGNOSIS — Z87891 Personal history of nicotine dependence: Secondary | ICD-10-CM

## 2021-02-08 DIAGNOSIS — R1084 Generalized abdominal pain: Secondary | ICD-10-CM | POA: Diagnosis not present

## 2021-02-08 DIAGNOSIS — U071 COVID-19: Secondary | ICD-10-CM | POA: Diagnosis not present

## 2021-02-08 DIAGNOSIS — R079 Chest pain, unspecified: Secondary | ICD-10-CM | POA: Diagnosis not present

## 2021-02-08 DIAGNOSIS — I482 Chronic atrial fibrillation, unspecified: Secondary | ICD-10-CM | POA: Diagnosis not present

## 2021-02-08 DIAGNOSIS — R7989 Other specified abnormal findings of blood chemistry: Secondary | ICD-10-CM | POA: Diagnosis not present

## 2021-02-08 DIAGNOSIS — I4891 Unspecified atrial fibrillation: Secondary | ICD-10-CM | POA: Diagnosis not present

## 2021-02-08 DIAGNOSIS — R0789 Other chest pain: Secondary | ICD-10-CM | POA: Diagnosis not present

## 2021-02-08 DIAGNOSIS — Z8673 Personal history of transient ischemic attack (TIA), and cerebral infarction without residual deficits: Secondary | ICD-10-CM | POA: Diagnosis not present

## 2021-02-08 DIAGNOSIS — Z952 Presence of prosthetic heart valve: Secondary | ICD-10-CM | POA: Diagnosis not present

## 2021-02-08 DIAGNOSIS — Z7951 Long term (current) use of inhaled steroids: Secondary | ICD-10-CM | POA: Diagnosis not present

## 2021-02-08 DIAGNOSIS — R918 Other nonspecific abnormal finding of lung field: Secondary | ICD-10-CM | POA: Diagnosis not present

## 2021-02-08 DIAGNOSIS — I2510 Atherosclerotic heart disease of native coronary artery without angina pectoris: Secondary | ICD-10-CM | POA: Diagnosis not present

## 2021-02-08 DIAGNOSIS — Z7982 Long term (current) use of aspirin: Secondary | ICD-10-CM | POA: Diagnosis not present

## 2021-02-08 DIAGNOSIS — R002 Palpitations: Secondary | ICD-10-CM | POA: Diagnosis not present

## 2021-02-08 DIAGNOSIS — R0602 Shortness of breath: Secondary | ICD-10-CM | POA: Diagnosis not present

## 2021-02-08 DIAGNOSIS — I959 Hypotension, unspecified: Secondary | ICD-10-CM | POA: Diagnosis not present

## 2021-02-08 DIAGNOSIS — Z7901 Long term (current) use of anticoagulants: Secondary | ICD-10-CM | POA: Diagnosis not present

## 2021-02-08 DIAGNOSIS — Z79899 Other long term (current) drug therapy: Secondary | ICD-10-CM | POA: Diagnosis not present

## 2021-02-08 DIAGNOSIS — R000 Tachycardia, unspecified: Secondary | ICD-10-CM | POA: Diagnosis not present

## 2021-02-09 DIAGNOSIS — R079 Chest pain, unspecified: Secondary | ICD-10-CM | POA: Diagnosis not present

## 2021-02-09 DIAGNOSIS — I4891 Unspecified atrial fibrillation: Secondary | ICD-10-CM | POA: Diagnosis not present

## 2021-02-09 DIAGNOSIS — R072 Precordial pain: Secondary | ICD-10-CM | POA: Diagnosis not present

## 2021-02-09 DIAGNOSIS — I2510 Atherosclerotic heart disease of native coronary artery without angina pectoris: Secondary | ICD-10-CM | POA: Diagnosis not present

## 2021-02-09 DIAGNOSIS — R778 Other specified abnormalities of plasma proteins: Secondary | ICD-10-CM | POA: Diagnosis not present

## 2021-02-09 DIAGNOSIS — I214 Non-ST elevation (NSTEMI) myocardial infarction: Secondary | ICD-10-CM | POA: Diagnosis not present

## 2021-02-09 DIAGNOSIS — Z952 Presence of prosthetic heart valve: Secondary | ICD-10-CM | POA: Diagnosis not present

## 2021-02-09 DIAGNOSIS — I480 Paroxysmal atrial fibrillation: Secondary | ICD-10-CM | POA: Diagnosis not present

## 2021-06-22 ENCOUNTER — Encounter: Payer: Medicare Other | Admitting: Internal Medicine

## 2022-03-13 ENCOUNTER — Other Ambulatory Visit: Payer: Self-pay | Admitting: Physician Assistant

## 2022-03-13 MED ORDER — AZITHROMYCIN 250 MG PO TABS: ORAL_TABLET | ORAL | 1 refills | Status: AC
